# Patient Record
Sex: Male | Born: 1970 | Race: White | Hispanic: No | Marital: Married | Smoking: Never smoker
Health system: Southern US, Community
[De-identification: ages and names within clinical notes are randomized; demographics above are authoritative.]

## PROBLEM LIST (undated history)

## (undated) DIAGNOSIS — B029 Zoster without complications: Secondary | ICD-10-CM

## (undated) DIAGNOSIS — M1990 Unspecified osteoarthritis, unspecified site: Secondary | ICD-10-CM

## (undated) DIAGNOSIS — T7840XA Allergy, unspecified, initial encounter: Secondary | ICD-10-CM

## (undated) DIAGNOSIS — F329 Major depressive disorder, single episode, unspecified: Secondary | ICD-10-CM

## (undated) DIAGNOSIS — Z87442 Personal history of urinary calculi: Secondary | ICD-10-CM

## (undated) HISTORY — DX: Zoster without complications: B02.9

## (undated) HISTORY — DX: Allergy, unspecified, initial encounter: T78.40XA

## (undated) HISTORY — DX: Major depressive disorder, single episode, unspecified: F32.9

---

## 2005-07-13 ENCOUNTER — Ambulatory Visit: Payer: Self-pay | Admitting: Internal Medicine

## 2006-07-29 ENCOUNTER — Ambulatory Visit: Payer: Self-pay | Admitting: Internal Medicine

## 2006-12-03 ENCOUNTER — Ambulatory Visit: Payer: Self-pay | Admitting: Internal Medicine

## 2006-12-05 ENCOUNTER — Ambulatory Visit: Payer: Self-pay | Admitting: Internal Medicine

## 2007-05-13 ENCOUNTER — Ambulatory Visit: Payer: Self-pay | Admitting: Internal Medicine

## 2007-05-13 LAB — CONVERTED CEMR LAB: TSH: 0.91 microintl units/mL (ref 0.35–5.50)

## 2007-05-14 LAB — CONVERTED CEMR LAB

## 2007-05-20 ENCOUNTER — Telehealth (INDEPENDENT_AMBULATORY_CARE_PROVIDER_SITE_OTHER): Payer: Self-pay | Admitting: *Deleted

## 2007-06-13 ENCOUNTER — Telehealth: Payer: Self-pay | Admitting: Internal Medicine

## 2007-07-07 ENCOUNTER — Ambulatory Visit: Payer: Self-pay | Admitting: Internal Medicine

## 2007-12-16 ENCOUNTER — Ambulatory Visit: Payer: Self-pay | Admitting: Internal Medicine

## 2008-01-05 ENCOUNTER — Ambulatory Visit: Payer: Self-pay | Admitting: Internal Medicine

## 2008-06-15 ENCOUNTER — Ambulatory Visit: Payer: Self-pay | Admitting: Internal Medicine

## 2008-06-28 ENCOUNTER — Ambulatory Visit: Payer: Self-pay | Admitting: Internal Medicine

## 2008-06-30 ENCOUNTER — Ambulatory Visit: Payer: Self-pay | Admitting: Internal Medicine

## 2008-07-02 ENCOUNTER — Telehealth: Payer: Self-pay | Admitting: Internal Medicine

## 2008-07-02 ENCOUNTER — Ambulatory Visit: Payer: Self-pay | Admitting: Internal Medicine

## 2008-07-03 DIAGNOSIS — F329 Major depressive disorder, single episode, unspecified: Secondary | ICD-10-CM

## 2008-07-03 DIAGNOSIS — F3289 Other specified depressive episodes: Secondary | ICD-10-CM

## 2008-07-03 HISTORY — DX: Other specified depressive episodes: F32.89

## 2008-07-03 HISTORY — DX: Major depressive disorder, single episode, unspecified: F32.9

## 2008-12-09 ENCOUNTER — Telehealth: Payer: Self-pay | Admitting: Internal Medicine

## 2009-08-11 ENCOUNTER — Telehealth: Payer: Self-pay | Admitting: Internal Medicine

## 2009-08-15 ENCOUNTER — Ambulatory Visit: Payer: Self-pay | Admitting: Internal Medicine

## 2009-08-15 DIAGNOSIS — J309 Allergic rhinitis, unspecified: Secondary | ICD-10-CM | POA: Insufficient documentation

## 2010-01-03 ENCOUNTER — Encounter: Payer: Self-pay | Admitting: Internal Medicine

## 2010-04-26 ENCOUNTER — Encounter: Payer: Self-pay | Admitting: Internal Medicine

## 2010-05-17 ENCOUNTER — Encounter: Payer: Self-pay | Admitting: Internal Medicine

## 2010-06-08 ENCOUNTER — Ambulatory Visit: Payer: Self-pay | Admitting: Internal Medicine

## 2010-06-08 DIAGNOSIS — B029 Zoster without complications: Secondary | ICD-10-CM

## 2010-06-08 HISTORY — DX: Zoster without complications: B02.9

## 2010-09-24 HISTORY — PX: HIP SURGERY: SHX245

## 2010-10-03 ENCOUNTER — Ambulatory Visit
Admission: RE | Admit: 2010-10-03 | Discharge: 2010-10-03 | Payer: Self-pay | Source: Home / Self Care | Attending: Family Medicine | Admitting: Family Medicine

## 2010-10-24 NOTE — Consult Note (Signed)
Summary: [facility]  [facility]   Imported By: Maryln Gottron 01/11/2010 11:08:40  _____________________________________________________________________  External Attachment:    Type:   Image     Comment:   External Document

## 2010-10-24 NOTE — Consult Note (Signed)
Summary:  [facility]  [facility]   Imported By: Maryln Gottron 05/10/2010 13:19:26  _____________________________________________________________________  External Attachment:    Type:   Image     Comment:   External Document

## 2010-10-24 NOTE — Assessment & Plan Note (Signed)
Summary: ?shingles/njr   Vital Signs:  Costa profile:   41 year old male Weight:      206 pounds Temp:     98.8 degrees F oral BP sitting:   120 / 70  (right arm) Cuff size:   regular  Vitals Entered By: Duard Brady LPN (June 08, 2010 1:04 PM) CC: ?shingles to trunk - itching and pain Is Costa Diabetic? No   CC:  ?shingles to trunk - itching and pain.  History of Present Illness: Jonathan Costa, who presents with a 4-day history of a painful dermatitis involving his right anterolateral chest wall area.  He did notice some mild discomfort that preceded the rash.  Allergies: 1)  ! * Pink Dye of Benadryl  Past History:  Past Medical History: Allergic rhinitis Depression shingles September 2011  Review of Systems       The Costa complains of suspicious skin lesions.    Physical Exam  General:  Well-developed,well-nourished,in no acute distress; alert,appropriate and cooperative throughout examination Skin:  erythematous herpetic rash involving his right anterolateral chest wall   Impression & Recommendations:  Problem # 1:  SHINGLES (ICD-053.9)  Complete Medication List: 1)  Aleve 220 Mg Tabs (Naproxen sodium) .... As needed 2)  Allegra-d 12 Hour 60-120 Mg Tb12 (Fexofenadine-pseudoephedrine) .... Once daily 3)  Celexa 20 Mg Tabs (Citalopram hydrobromide) .... 1/2 once daily 4)  Multivitamins Tabs (Multiple vitamin) .... Once daily 5)  Fluticasone Propionate 50 Mcg/act Susp (Fluticasone propionate) .... 2 sprays each nostril once daily 6)  Valacyclovir Hcl 1 Gm Tabs (Valacyclovir hcl) .... One tablet 3  times daily 7)  Prednisone (pak) 5 Mg Tabs (Prednisone) .... As directed 8)  Hydrocodone-acetaminophen 5-500 Mg Tabs (Hydrocodone-acetaminophen) .... One or two every 6 hours as needed for pain  Costa Instructions: 1)  Please schedule a follow-up appointment as needed. Prescriptions: HYDROCODONE-ACETAMINOPHEN 5-500 MG TABS  (HYDROCODONE-ACETAMINOPHEN) one or two every 6 hours as needed for pain  #50 x 0   Entered and Authorized by:   Gordy Savers  MD   Signed by:   Gordy Savers  MD on 06/08/2010   Method used:   Print then Give to Costa   RxID:   [id number] PREDNISONE (PAK) 5 MG TABS (PREDNISONE) as directed  #5 mg 6 day x 0   Entered and Authorized by:   Gordy Savers  MD   Signed by:   Gordy Savers  MD on 06/08/2010   Method used:   Print then Give to Costa   RxID:   [id number] VALACYCLOVIR HCL 1 GM TABS (VALACYCLOVIR HCL) one tablet 3  times daily  #21 x 0   Entered and Authorized by:   Gordy Savers  MD   Signed by:   Gordy Savers  MD on 06/08/2010   Method used:   Print then Give to Costa   RxID:   [id number]

## 2010-10-24 NOTE — Consult Note (Signed)
Summary: [facility]  [facility]   Imported By: Sherian Rein 06/02/2010 13:21:54  _____________________________________________________________________  External Attachment:    Type:   Image     Comment:   External Document

## 2010-10-26 NOTE — Assessment & Plan Note (Signed)
Summary: ?sinus inf or bronchitus/cjr   Vital Signs:  Patient profile:   40 year old male Temp:     97.9 degrees F oral BP sitting:   120 / 78  (left arm) Cuff size:   regular  Vitals Entered By: Sid Falcon LPN (October 03, 2010 9:34 AM)  History of Present Illness: "Cold" since Christmas.  Started as head cold.  Still has some sinus congestion. Allegra D and Flonase not helping.  Maliase.  No fever. Cough productive for over 2 weeks.  Headaches and some L max pain.  Pt has perennial allergies.  meds above not  working.    Allergies: 1)  ! 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  Otoscopic examination reveals clear canals, tympanic membranes are intact bilaterally without bulging, retraction, inflammation or discharge. Hearing is grossly normal bilaterally. Nose:  erythema and swelling nasal mucosa. Mouth:  Oral mucosa and oropharynx without lesions or exudates.  Teeth in good repair. Neck:  No deformities, masses, or tenderness noted. Lungs:  Normal respiratory effort, chest expands symmetrically. Lungs are clear to auscultation, no crackles or wheezes. Heart:  Normal rate and regular rhythm. S1 and S2 normal without gallop, murmur, click, rub or other extra sounds.   Impression & Recommendations:  Problem # 1:  SINUSITIS, ACUTE  (ICD-461.9)  His updated medication list for this problem includes:    Allegra-d 12 Hour 60-120 Mg Tb12 (Fexofenadine-pseudoephedrine) ..... Once daily    Fluticasone Propionate 50 Mcg/act Susp (Fluticasone propionate) .Marland Kitchen... 2 sprays each nostril once daily    Amoxicillin-pot Clavulanate 875-125 Mg Tabs (Amoxicillin-pot clavulanate) ..... One by mouth two times a day for 10 days    Astelin 137 Mcg/spray Soln (Azelastine hcl) .Marland Kitchen... 1-2 sprays per nostril two times a day  Complete Medication List: 1)  Aleve 220 Mg Tabs (Naproxen sodium) .... As needed 2)  Allegra-d 12 Hour 60-120 Mg Tb12 (Fexofenadine-pseudoephedrine) .... Once daily 3)  Celexa 20 Mg Tabs (Citalopram hydrobromide) .... 1/2 once daily 4)  Multivitamins Tabs (Multiple vitamin) .... Once daily 5)  Fluticasone Propionate 50 Mcg/act Susp (Fluticasone propionate) .... 2 sprays each nostril once daily 6)  Valacyclovir Hcl 1 Gm Tabs (Valacyclovir hcl) .... One tablet 3  times daily 7)  Prednisone (pak) 5 Mg Tabs (Prednisone) .... As directed 8)  Hydrocodone-acetaminophen 5-500 Mg Tabs (Hydrocodone-acetaminophen) .... One or two every 6 hours as needed for pain 9)  Amoxicillin-pot Clavulanate 875-125 Mg Tabs (Amoxicillin-pot clavulanate) .... One by mouth two times a day for 10 days 10)  Astelin 137 Mcg/spray Soln (Azelastine hcl) .Marland Kitchen.. 1-2 sprays per nostril two times a day  Patient Instructions: 1)  Acute sinusitis symptoms for less than 10 days are not helped by antibiotics. Use warm moist compresses, and over the counter decongestants( only as directed). Call if no improvement in 5-7 days, sooner if increasing pain, fever, or new symptoms.  Prescriptions: ASTELIN 137 MCG/SPRAY SOLN (AZELASTINE HCL) 1-2 sprays per nostril two times a  day  #1 x 11   Entered and Authorized by:   Evelena Peat MD   Signed by:   Evelena Peat MD on 10/03/2010   Method used:   Electronically to        [facility] [address] [phone]* (retail)        [address]       [place], [place]  [address]       Ph: [phone] or [phone]       Fax: [phone]   RxID:   [id number] AMOXICILLIN-POT CLAVULANATE 875-125 MG TABS (AMOXICILLIN-POT CLAVULANATE) one by mouth two times a day for 10 days  #20 x 0   Entered and Authorized by:   Evelena Peat MD   Signed by:   Evelena Peat MD on 10/03/2010   Method used:   [facility]ically to        [facility] [address] [phone]* (retail)       [address]       [place], [place]  [address]       Ph: [phone] or [phone]       Fax: [phone]   RxID:   [id number]    Orders Added: 1)  Est. Patient Level III [76160]

## 2010-11-18 ENCOUNTER — Encounter (HOSPITAL_COMMUNITY): Payer: Self-pay | Admitting: Radiology

## 2010-11-18 ENCOUNTER — Emergency Department (HOSPITAL_COMMUNITY): Payer: PRIVATE HEALTH INSURANCE

## 2010-11-18 ENCOUNTER — Inpatient Hospital Stay (HOSPITAL_COMMUNITY)
Admission: EM | Admit: 2010-11-18 | Discharge: 2010-11-24 | DRG: 498 | Disposition: A | Discharge status: Home / Self Care | Payer: PRIVATE HEALTH INSURANCE | Attending: General Surgery | Admitting: General Surgery

## 2010-11-18 DIAGNOSIS — R51 Headache: Secondary | ICD-10-CM | POA: Diagnosis not present

## 2010-11-18 DIAGNOSIS — S7000XA Contusion of unspecified hip, initial encounter: Secondary | ICD-10-CM | POA: Diagnosis present

## 2010-11-18 DIAGNOSIS — S0120XA Unspecified open wound of nose, initial encounter: Secondary | ICD-10-CM | POA: Diagnosis present

## 2010-11-18 DIAGNOSIS — D62 Acute posthemorrhagic anemia: Secondary | ICD-10-CM | POA: Diagnosis not present

## 2010-11-18 DIAGNOSIS — M25519 Pain in unspecified shoulder: Secondary | ICD-10-CM | POA: Diagnosis present

## 2010-11-18 DIAGNOSIS — S73016A Posterior dislocation of unspecified hip, initial encounter: Secondary | ICD-10-CM | POA: Diagnosis present

## 2010-11-18 DIAGNOSIS — W208XXA Other cause of strike by thrown, projected or falling object, initial encounter: Secondary | ICD-10-CM | POA: Diagnosis present

## 2010-11-18 DIAGNOSIS — S32409A Unspecified fracture of unspecified acetabulum, initial encounter for closed fracture: Principal | ICD-10-CM | POA: Diagnosis present

## 2010-11-18 DIAGNOSIS — B029 Zoster without complications: Secondary | ICD-10-CM | POA: Diagnosis not present

## 2010-11-18 LAB — COMPREHENSIVE METABOLIC PANEL
Alkaline Phosphatase: 63 U/L (ref 39–117)
BUN: 15 mg/dL (ref 6–23)
Chloride: 104 mEq/L (ref 96–112)
Glucose, Bld: 168 mg/dL — ABNORMAL HIGH (ref 70–99)
Potassium: 3.6 mEq/L (ref 3.5–5.1)
Total Bilirubin: 0.5 mg/dL (ref 0.3–1.2)
Total Protein: 6.5 g/dL (ref 6.0–8.3)

## 2010-11-18 LAB — CBC
HCT: 42.3 % (ref 39.0–52.0)
MCH: 31.1 pg (ref 26.0–34.0)
MCV: 90.8 fL (ref 78.0–100.0)
RDW: 12.5 % (ref 11.5–15.5)
WBC: 12.5 10*3/uL — ABNORMAL HIGH (ref 4.0–10.5)

## 2010-11-18 LAB — POCT I-STAT, CHEM 8
BUN: 18 mg/dL (ref 6–23)
Calcium, Ion: 1.05 mmol/L — ABNORMAL LOW (ref 1.12–1.32)
Chloride: 106 mEq/L (ref 96–112)
Creatinine, Ser: 1.1 mg/dL (ref 0.4–1.5)

## 2010-11-18 LAB — LACTIC ACID, PLASMA
Lactic Acid, Venous: 3.3 mmol/L — ABNORMAL HIGH (ref 0.5–2.2)
Lactic Acid, Venous: 3.8 mmol/L — ABNORMAL HIGH (ref 0.5–2.2)

## 2010-11-18 MED ORDER — IOHEXOL 300 MG/ML  SOLN
100.0000 mL | Freq: Once | INTRAMUSCULAR | Status: AC | PRN
  Administered 2010-11-18: 100 mL via INTRAVENOUS

## 2010-11-19 ENCOUNTER — Inpatient Hospital Stay (HOSPITAL_COMMUNITY): Payer: PRIVATE HEALTH INSURANCE

## 2010-11-19 LAB — CBC
HCT: 38.8 % — ABNORMAL LOW (ref 39.0–52.0)
MCHC: 33 g/dL (ref 30.0–36.0)
MCV: 91.1 fL (ref 78.0–100.0)
RDW: 12.7 % (ref 11.5–15.5)

## 2010-11-19 LAB — BASIC METABOLIC PANEL
BUN: 14 mg/dL (ref 6–23)
Calcium: 8.5 mg/dL (ref 8.4–10.5)
GFR calc non Af Amer: >60 mL/min (ref >60)
Glucose, Bld: 162 mg/dL — ABNORMAL HIGH (ref 70–99)

## 2010-11-20 ENCOUNTER — Inpatient Hospital Stay (HOSPITAL_COMMUNITY): Payer: PRIVATE HEALTH INSURANCE

## 2010-11-20 LAB — POCT I-STAT 4, (NA,K, GLUC, HGB,HCT)
Glucose, Bld: 92 mg/dL (ref 70–99)
Glucose, Bld: 99 mg/dL (ref 70–99)
HCT: 33 % — ABNORMAL LOW (ref 39.0–52.0)
Hemoglobin: 11.2 g/dL — ABNORMAL LOW (ref 13.0–17.0)
Potassium: 4 meq/L (ref 3.5–5.1)
Sodium: 140 meq/L (ref 135–145)

## 2010-11-20 LAB — CBC
HCT: 35 % — ABNORMAL LOW (ref 39.0–52.0)
Platelets: 274 10*3/uL (ref 150–400)
RDW: 13 % (ref 11.5–15.5)
WBC: 12.2 10*3/uL — ABNORMAL HIGH (ref 4.0–10.5)

## 2010-11-20 LAB — URINALYSIS, ROUTINE W REFLEX MICROSCOPIC
Nitrite: NEGATIVE
Protein, ur: NEGATIVE mg/dL
Urine Glucose, Fasting: NEGATIVE mg/dL

## 2010-11-21 LAB — CBC
HCT: 30.7 % — ABNORMAL LOW (ref 39.0–52.0)
Hemoglobin: 10 g/dL — ABNORMAL LOW (ref 13.0–17.0)
MCH: 30.1 pg (ref 26.0–34.0)
MCV: 92.5 fL (ref 78.0–100.0)
RBC: 3.32 MIL/uL — ABNORMAL LOW (ref 4.22–5.81)
WBC: 11.8 10*3/uL — ABNORMAL HIGH (ref 4.0–10.5)

## 2010-11-21 LAB — BASIC METABOLIC PANEL
BUN: 6 mg/dL (ref 6–23)
CO2: 32 mEq/L (ref 19–32)
Chloride: 102 mEq/L (ref 96–112)
Creatinine, Ser: 0.84 mg/dL (ref 0.4–1.5)
Glucose, Bld: 100 mg/dL — ABNORMAL HIGH (ref 70–99)
Potassium: 4.2 mEq/L (ref 3.5–5.1)
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
## 2010-11-22 LAB — CBC
HCT: 29.7 % — ABNORMAL LOW (ref 39.0–52.0)
MCH: 30.2 pg (ref 26.0–34.0)
MCV: 91.7 fL (ref 78.0–100.0)
Platelets: 262 10*3/uL (ref 150–400)
RDW: 12.4 % (ref 11.5–15.5)
WBC: 9.4 10*3/uL (ref 4.0–10.5)

## 2010-11-23 ENCOUNTER — Inpatient Hospital Stay (HOSPITAL_COMMUNITY): Payer: PRIVATE HEALTH INSURANCE

## 2010-11-23 LAB — TYPE AND SCREEN
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
## 2010-11-23 NOTE — Consult Note (Signed)
NAMEORBY, TANGEN               ACCOUNT NO.:  [id number]  MEDICAL RECORD NO.:  [id number]           PATIENT TYPE:  I  LOCATION:  5004                         FACILITY:  [facility]  PHYSICIAN:  Doralee Albino. Carola Frost, M.D. DATE OF BIRTH:  January 14, 1971  DATE OF CONSULTATION:  11/18/2010 DATE OF DISCHARGE:                                CONSULTATION   REQUESTING PHYSICIAN:  Trauma Service, Dr. Magnus Ivan.  REASON FOR CONSULTATION REQUEST:  Right acetabular fracture dislocation.  BRIEF HISTORY OF PRESENTATION:  Jonathan Costa is a 40 year old male otherwise healthy individual who was cutting ivy in his yard when a tree totally unrelated to his yard work spontaneously cracked and fell on him.  He was able to avoid enough direct impact that he survived, but was crushed beneath the tree with a fracture-dislocation of his right hip.  He was unable to move and sustained trauma to the left shoulder and nose.  He was transported by EMS to [facility] where further trauma evaluation was performed.  I was contacted for definitive management of this fracture while the patient was in the [place].  On evaluation, he is alert and oriented, and he accompanied by his father and wife who was able to answer all questions appropriately.  He denied head injury.  Denied numbness, tingling, and right knee pain.  PAST MEDICAL HISTORY:  History of small rotator cuff tear, left shoulder; history of vertigo and shingles over the past year.  No other medical problems.  PAST SURGICAL HISTORY:  Wisdom teeth extraction, otherwise none.  SOCIAL HISTORY:  The patient does not smoke, use drugs, or drink alcohol significantly.  He is allergic BENADRYL.  He does not take any medications regularly.  REVIEW OF SYSTEMS:  Negative for chest pain, shortness of breath, recent infection, bleeding disorder, or neurologic condition.  PHYSICAL EXAMINATION:  The bridge of the nose has a significant laceration, superior portion  of which appears to have been Dermabonded, and inferior portion, which is continuing to bleed appears to be deep and is about 15 mm in length. The left shoulder is tender along the scapula posteriorly, but does not have any focal tenderness of the glenohumeral joint.  No instability. AC joints are nontender bilaterally.  No ecchymosis, crepitus, or decreased motion about the shoulders, elbows, wrists, and hands.  His pelvis is stable, nontender.  His right hip is flexed to 70 degrees, and his knee bent to 125.  He has no knee effusion.  There is an abrasion over the right hip and associated hematoma.  He does not have any joint line discomfort of the knee.  The ankle and tibia is nontender, no ecchymosis, no crepitus.  Foot nontender, no crepitus.  Strength examination obviously could not be performed.  Intact superficial peroneal and tibial nerve sensation, able to extend the great and lesser toes.  I was unable to assess eversion.  DP pulses are 2+ as was posterior tib.  Left lower extremity free of any focal ecchymosis, tenderness, crepitus, cantilever, or instability.  Intact deep peroneal, superficial peroneal, and tibial nerve sensory, and motor function.  I was unable to roll  him and palpate his back.  However, this had been performed by the Trauma Service, and they did not distinguish any focal evidence of tenderness, step-offs, or other sign indicative of spinal injury.  X-RAYS:  Plain film demonstrates a fracture-dislocation of the right acetabulum with a transverse posterior wall pattern and significant comminution along the margin of the posterior wall fracture.  ASSESSMENT: 1. Right hip fracture dislocation involving the acetabulum. 2. Nasal fracture.  PLAN:  I have discussed the risks and benefits of closed reduction and the risks and benefits of placement of a traction pin through the tibia. These include nerve injury, vessel injury, incarceration of fragments within  the joint, failure to prevent AVN or arthritis, the need for subsequent surgery including surgical reconstruction of the fracture and multiple others.  Furthermore, we discussed the nose fracture, the possibility of the need for consultation with facial surgeon including ENT or plastics.  We will clean out the wound and possibly proceed with repair depending upon the intraoperative findings.  After full discussion, both the patient and his wife strongly wished to proceed. Informed consent was obtained.     Doralee Albino. Carola Frost, M.D.     MHH/MEDQ  D:  11/18/2010  T:  11/19/2010  Job:  [id number]  Electronically Signed by Myrene Galas M.D. on 11/23/2010 12:02:17 PM

## 2010-11-23 NOTE — Op Note (Signed)
Jonathan Costa, Jonathan Costa               ACCOUNT NO.:  [id number]  MEDICAL RECORD NO.:  [id number]           PATIENT TYPE:  I  LOCATION:  5004                         FACILITY:  [facility]  PHYSICIAN:  Doralee Albino. Carola Frost, M.D. DATE OF BIRTH:  1971-04-28  DATE OF PROCEDURE:  11/20/2010 DATE OF DISCHARGE:                              OPERATIVE REPORT   PREOPERATIVE DIAGNOSES: 1. Right transverse posterior wall acetabular fracture, status post     relocation. 2. Retained traction pin.  POSTOPERATIVE DIAGNOSES: 1. Right transverse posterior wall acetabular fracture, status post     relocation. 2. Retained traction pin. 3. Morel-Lavallee lesion.  PROCEDURES: 1. ORIF of right transverse posterior wall acetabular fracture. 2. Incision and drainage of right Morel-Lavallee degloving lesion. 3. Removal of traction pin.  SURGEON:  Doralee Albino. Carola Frost, MD.  ASSISTANT:  Mearl Latin, PA-C.  ANESTHESIA:  General.  COMPLICATIONS:  None.  DRAINS:  One into the [facility] lesion.  FINDINGS:  Brisk bleeding with placement of a drill just off the articular margin of the femoral head.  Additional findings, extensive comminution of the articular surface with marginal impaction and intra- articular fragments.  BLOOD LOSS:  Approximately 500 mL, 250 of which was from the [facility] lesion.  DISPOSITION:  To PACU.  CONDITION:  Stable.  BRIEF SUMMARY AND INDICATIONS FOR PROCEDURE:  Jonathan Costa is a 40- year-old male who was struck by a 50 feet tree, resulting a fracture/dislocation of the right hip.  He underwent closed reduction with placement of skeletal traction for suspected intra-articular fragments approximately 48 hours before.  I discussed with him and his wife the risks and benefits of surgery including the possibility of infection, nerve injury, vessel injury, instability, arthritis, DVT, PE, avascular necrosis, need for further surgery including total hip arthroplasty, and multiple  others.  After full discussion, he and his wife wished to proceed.  BRIEF DESCRIPTION OF PROCEDURE:  Jonathan Costa was taken to the operating room where general anesthesia was induced.  Foley catheter was placed.  The traction pin was clipped near the skin and withdrawn without complication.  The right lower extremity was then prepped and draped in usual sterile fashion after positioning him right side up with all prominences padded appropriately.  Standard Kocher-Langenbeck incision and approach was used splitting the tensor in line with the skin incision.  The maximus split exceptionally well along muscular plane with no disruption of the muscular fibers.  He had considerable swelling.  I encountered fat and serous material at the apex of the incision where Morel-Lavallee lesion was identified.  This extended into a large pocket completely over the posterior iliac crest and was large enough in which to place a hand.  However, we kept the neck closed and evacuated the hematoma.  The medius muscle was retracted anteriorly exposing the minimus, which had considerable contusion and the most posterior or lateral aspect of the minimus was noncontractile necrotic and consequently debrided.  We immediately encountered multiple fragments of articular surface and these were saved and placed in saline.  Marginal impaction could be visualized along the femoral head. There was a rim fracture.  A large inferior-posterior wall fragment and then additional rim fragment.  A Schanz pin was placed into the proximal femur, and after thoroughly irrigating with a pulsatile lavage, the hip joint was distracted.  Numerous free fragments were removed from the joint and the ligamentum was debrided.  This greatly facilitated removal.  I did not identify any large scale scuffs on the femoral head or full-thickness cartilage loss.  I did drill the femoral head just off the edge of the articular surface and encountered  immediate brisk bleeding consistent with intact vascularity.  The transverse component of the fracture was dealt with first by using a Synthes four-hole plate slightly over-contoured just at the fracture edge to allow for compression on the far side or anterior aspect of the fracture. Initially, the 2 extreme holes were placed in compression mode to further squeeze this transverse fracture together posteriorly with visualization of compression at the fracture site and an anatomic reduction.  The 2 mean screws were also bicortical and as tightened produced additional compression on the anterior column aspect of the transverse fracture. Once this was complete, the pieces of osteochondral surface along the posterior wall were replaced first addressing the marginal impaction and then placing a large wall piece.  I then reconstructed the rim and placed 2 lag screws, 1 posterior-inferior and 1 posterior-superior.  I was unable to place the more inferior lag screw in a way that could be clearly discern is being outside the joint and consequently left this screw which appeared to result in an anatomic reduction while fashioning a spring plate to hook onto the rim.  Two screws were placed in this, achieving first contact and maintenance of reduction and then compression of the rim fragment against the rest of the wall.  Multiple C-arm images were used to confirm completely extra-articular placement. The buttress plate was then secured in the ischium and wrapped around the posterior wall and secured with 2 screws proximally and then placing an additional screw along the ischium to further tighten down the plate against the posterior wall.  This resulted in what appeared to be an anatomic reduction and excellent buttress of the transverse and posterior wall fractures.  There was a triangle of bone loss that was filled with Plexur M allograft allowing that harden and cure into place prior to  placement of the buttress plate over top.  Final images showed again appropriate and concentric reduction, hardware placement, length and trajectory.  The short rotators and piriformis which had been divided and carefully retracted to allow for subperiosteal dissection down the ischium and on the retroacetabular surface. We then repaired back through bone tunnels securing first the obturator internus or short rotator tendon which was quite robust as well as the piriformis tendon. Prior to closure, extensive lavage was once more performed with the Pulsavac and the Pulsavac was used again on the subcutaneous tissues prior to their closure.  Tensor was reapproximated with figure-of-eight interrupted #1 Vicryl, 0 for the deep subcu, 2-0 and staples for the shallow subcu and skin respectively.  Sterile gently compressive dressing was applied and an abduction pillow.  Prior to closure of the main wound, we did once more evacuate the [facility] lesion and placed a large Hemovac within this cavity, bring it out through a separate incision to avoid contamination with wound, which was closed securely along its apex to prevent any or to minimize the chance of cross contamination.  Montez Morita, PA-C assisted me throughout the procedure, was absolutely necessary for  the safe and effective completion of the case as he distracted the hip joint, and retracted the abductors and sciatic nerve to keep free from injury at all times.  The patient's hip was placed in excessive abduction of at least 40 degrees throughout the case with full hip extension and full knee flexion to further relax the sciatic nerve.  PROGNOSIS:  Postoperative examination in the PACU demonstrated intact sciatic nerve function grossly.  Jonathan Costa will maintain posterior hip precautions and touchdown weightbearing for the next 8 weeks with graduated weightbearing thereafter.  He is at significantly increased risk for early onset arthritis  given his articular instruction.  At this point, it appears he has maintained vascularity despite the dislocation. He will be on DVT prophylaxis with Lovenox and discharged with a 20-day course.  We anticipate radiation treatment for HO prophylaxis in the next 24 to 48 hours.     Doralee Albino. Carola Frost, M.D.     MHH/MEDQ  D:  11/20/2010  T:  11/21/2010  Job:  [id number]  Electronically Signed by Myrene Galas M.D. on 11/23/2010 12:09:05 PM

## 2010-12-13 NOTE — Discharge Summary (Signed)
NAMEGAEL, Jonathan Costa               ACCOUNT NO.:  [id number]  MEDICAL RECORD NO.:  [id number]           PATIENT TYPE:  I  LOCATION:  5004                         FACILITY:  [facility]  PHYSICIAN:  Gabrielle Dare. [name], M.D.DATE OF BIRTH:  03/27/1971  DATE OF ADMISSION:  11/18/2010 DATE OF DISCHARGE:  11/24/2010                              DISCHARGE SUMMARY   DISCHARGE DIAGNOSES: 1. Blunt trauma. 2. Right acetabular fracture/dislocation. 3. Nasal laceration. 4. Alcohol use. 5. Acute blood loss anemia. 6. Shingles.  CONSULTANTS:  Dr. Pollyann Kennedy for facial surgery and Dr. Carola Frost for orthopedic surgery.  PROCEDURES: 1. Closed reduction, right hip dislocation with placement of skeletal     traction pin by Dr. Carola Frost. 2. I and D of nasal laceration by Dr. Carola Frost. 3. Complex closure nasal laceration by Dr. Pollyann Kennedy. 4. ORIF of acetabular fracture by Dr. Carola Frost.  HISTORY OF PRESENT ILLNESS:  This is a 40 year old white male who was working in his yard when  a large tree fell down striking him and pinning him under it.  He came to the hospital as a level II trauma complaining of left shoulder and right hip pain.  Workup showed acetabular fracture with a hip dislocation.  He was admitted and orthopedic surgery was consulted.  His nasal laceration reportedly was closed with some Dermabonds.  He was then taken to the operating room for emergency surgery.  HOSPITAL COURSE:  During surgery, it was felt that the nasal wound had foreign debridement.  It was reopened and washed out.  Facial surgery was consulted and performed a complex closure of it.  He had his hip relocated and a skeletal traction pin put in.  He was then transferred for further care to the floor.  After several days the patient was able to be taken back to the operating room for definitive fixation of his hip.  He had significant problems with pain.  He was started on one regimen which caused bad headache.  It was thought to be the  Ultram and so that was removed. However, the headaches persisted and so it was thought to be the oxycodone.  That was switched to a mixture of MS Contin and hydrocodone which seemed to fix the headache problem.  He had his pain adequately controlled on this.  He had done well with physical and occupational therapy following a radiation treatment to prevent heterotopic ossification.  He was able to be transferred home in good condition care of his wife.  DISCHARGE MEDICATIONS: 1. Lovenox 40 mg subcutaneously daily for 3 weeks #21 syringes with no     refill. 2. Senokot to take 1 tablet twice daily before meals, #60 with no     refill. 3. Robaxin 500 mg take every 8 hours as needed #90 with no refill. 4. Valacyclovir 1000 mg by mouth every 8 hours for a total treatment     of 1 week. 5. Dulcolax 10 mg by mouth twice daily #35 mg tablets with no refill. 6. Sustained-release morphine 50 mg tablets, take 3 p.o. q.8 h. #60     with no refill. 7. Norco 10/325 take 1-2  every 4 hours as needed #60 with no refill.  FOLLOWUP:  The patient will need to follow up with Dr. Carola Frost, Dr. Pollyann Kennedy and will call for appointments.  Follow up with Trauma Service will be on an as-needed basis.     Jonathan Costa, P.A.   ______________________________ Gabrielle Dare. [name], M.D.    MJ/MEDQ  D:  11/24/2010  T:  11/25/2010  Job:  [id number]  cc:   Doralee Albino. Carola Frost, M.D. Dr. Pollyann Kennedy  Electronically Signed by Charma Igo P.A. on 12/05/2010 01:31:48 PM Electronically Signed by Violeta Gelinas M.D. on 12/13/2010 04:38:30 PM

## 2011-01-19 ENCOUNTER — Other Ambulatory Visit: Payer: Self-pay | Admitting: *Deleted

## 2011-01-19 MED ORDER — CITALOPRAM HYDROBROMIDE 20 MG PO TABS: 20.0000 mg | ORAL_TABLET | Freq: Every day | ORAL | Status: DC

## 2011-03-29 ENCOUNTER — Other Ambulatory Visit: Payer: Self-pay | Admitting: Internal Medicine

## 2011-04-24 ENCOUNTER — Ambulatory Visit (INDEPENDENT_AMBULATORY_CARE_PROVIDER_SITE_OTHER): Payer: PRIVATE HEALTH INSURANCE | Admitting: Internal Medicine

## 2011-04-24 ENCOUNTER — Encounter: Payer: Self-pay | Admitting: Internal Medicine

## 2011-04-24 VITALS — BP 112/80 | HR 80 | Temp 98.4°F | Resp 20 | Ht 73.5 in | Wt 202.0 lb

## 2011-04-24 DIAGNOSIS — J069 Acute upper respiratory infection, unspecified: Secondary | ICD-10-CM

## 2011-04-24 MED ORDER — HYDROCODONE-ACETAMINOPHEN 5-500 MG PO TABS: 1.0000 | ORAL_TABLET | Freq: Four times a day (QID) | ORAL | Status: DC | PRN

## 2011-04-24 NOTE — Patient Instructions (Signed)
Get plenty of rest, Drink lots of  clear liquids, and use Tylenol or ibuprofen for fever and discomfort.    Call or return to clinic prn if these symptoms worsen or fail to improve as anticipated.  

## 2011-04-24 NOTE — Progress Notes (Signed)
  Subjective:    Patient ID: Jonathan Costa, male    DOB: 1971-05-29, 40 y.o.   MRN: [id number]  HPI  40 year old patient who awoke with fever last night. He has had some nonproductive cough chest congestion and mild sore throat. His wife has a similar illness that has resolved. He does have young children at home. Presently no fever or chills. Does have a history of allergic rhinitis but has not taken medications chronically at this time.    Review of Systems  Constitutional: Positive for fever and fatigue. Negative for chills and appetite change.  HENT: Positive for congestion. Negative for hearing loss, ear pain, sore throat, trouble swallowing, neck stiffness, dental problem, voice change and tinnitus.   Eyes: Negative for pain, discharge and visual disturbance.  Respiratory: Negative for cough, chest tightness, wheezing and stridor.   Cardiovascular: Negative for chest pain, palpitations and leg swelling.  Gastrointestinal: Negative for nausea, vomiting, abdominal pain, diarrhea, constipation, blood in stool and abdominal distention.  Genitourinary: Negative for urgency, hematuria, flank pain, discharge, difficulty urinating and genital sores.  Musculoskeletal: Negative for myalgias, back pain, joint swelling, arthralgias and gait problem.  Skin: Negative for rash.  Neurological: Negative for dizziness, syncope, speech difficulty, weakness, numbness and headaches.  Hematological: Negative for adenopathy. Does not bruise/bleed easily.  Psychiatric/Behavioral: Negative for behavioral problems and dysphoric mood. The patient is not nervous/anxious.        Objective:   Physical Exam  Constitutional: He is oriented to person, place, and time. He appears well-developed.  HENT:  Head: Normocephalic.  Right Ear: External ear normal.  Left Ear: External ear normal.  Eyes: Conjunctivae and EOM are normal.  Neck: Normal range of motion.  Cardiovascular: Normal rate and normal heart sounds.     Pulmonary/Chest: Effort normal and breath sounds normal. No respiratory distress. He has no wheezes. He has no rales. He exhibits no tenderness.  Abdominal: Bowel sounds are normal.  Musculoskeletal: Normal range of motion. He exhibits no edema and no tenderness.  Neurological: He is alert and oriented to person, place, and time.  Psychiatric: He has a normal mood and affect. His behavior is normal.          Assessment & Plan:   No problem-specific assessment & plan notes found for this encounter.   Viral URI. Treat symptomatically with antitussives Tylenol and naproxen

## 2011-04-26 ENCOUNTER — Telehealth: Payer: Self-pay | Admitting: *Deleted

## 2011-04-26 MED ORDER — DOXYCYCLINE HYCLATE 100 MG PO TABS: 100.0000 mg | ORAL_TABLET | Freq: Two times a day (BID) | ORAL | Status: DC

## 2011-04-26 NOTE — Telephone Encounter (Signed)
Pt is no better with sinus infection and would like an antibiotic. Still coughing really bad and congested.

## 2011-04-26 NOTE — Telephone Encounter (Signed)
Wife would like an antibiotic that is not sun sensitive as they are going to the beach this week.

## 2011-04-26 NOTE — Telephone Encounter (Signed)
Amoxicillin 500 mg #30 one 3 times daily with meals

## 2011-04-26 NOTE — Telephone Encounter (Signed)
Doxycycline 100  #20  BID

## 2011-04-27 MED ORDER — AMOXICILLIN 500 MG PO CAPS: 500.0000 mg | ORAL_CAPSULE | Freq: Three times a day (TID) | ORAL | Status: AC

## 2011-04-27 NOTE — Telephone Encounter (Signed)
Notified pt. 

## 2011-05-01 ENCOUNTER — Ambulatory Visit: Payer: PRIVATE HEALTH INSURANCE | Admitting: Internal Medicine

## 2012-04-18 ENCOUNTER — Encounter: Payer: Self-pay | Admitting: Internal Medicine

## 2012-04-18 ENCOUNTER — Ambulatory Visit (INDEPENDENT_AMBULATORY_CARE_PROVIDER_SITE_OTHER): Payer: BC Managed Care – PPO | Admitting: Internal Medicine

## 2012-04-18 VITALS — BP 128/82 | Temp 98.1°F | Wt 199.0 lb

## 2012-04-18 DIAGNOSIS — J329 Chronic sinusitis, unspecified: Secondary | ICD-10-CM

## 2012-04-18 MED ORDER — AMOXICILLIN 875 MG PO TABS: 875.0000 mg | ORAL_TABLET | Freq: Two times a day (BID) | ORAL | Status: AC

## 2012-04-18 NOTE — Patient Instructions (Addendum)
Use nasal saline as directed Please call our office if your symptoms do not improve or gets worse.  

## 2012-04-18 NOTE — Progress Notes (Signed)
  Subjective:    Patient ID: Jonathan Costa, male    DOB: [birth year], 41 y.o.   MRN: [id number]  URI  This is a new problem. The current episode started in the past 7 days. There has been no fever. Associated symptoms include congestion, coughing, ear pain and a sore throat. He has tried antihistamine for the symptoms. The treatment provided no relief.   Patient complains of sinus pain/congestion on left side. Sputum is discolored. (Yellow)      Review of Systems  HENT: Positive for ear pain, congestion and sore throat.   Respiratory: Positive for cough.    Past Medical History  Diagnosis Date  . DEPRESSION 07/03/2008  . SHINGLES 06/08/2010  . Allergy     History   Social History  . Marital Status: Married    Spouse Name: N/A    Number of Children: N/A  . Years of Education: N/A   Occupational History  . Not on file.   Social History Main Topics  . Smoking status: Never Smoker   . Smokeless tobacco: Never Used  . Alcohol Use: Yes  . Drug Use: No  . Sexually Active: Not on file   Other Topics Concern  . Not on file   Social History Narrative  . No narrative on file    No past surgical history on file.  No family history on file.  Allergies  Allergen Reactions  . Latex     Current Outpatient Prescriptions on File Prior to Visit  Medication Sig Dispense Refill  . fexofenadine-pseudoephedrine (ALLEGRA-D) 60-120 MG per tablet Take 1 tablet by mouth 2 (two) times daily.        . Multiple Vitamin (MULTIVITAMIN) tablet Take 1 tablet by mouth daily.        . naproxen sodium (ALEVE) 220 MG tablet Take 220 mg by mouth as needed.          BP 128/82  Temp 98.1 F (36.7 C) (Oral)  Wt 199 lb (90.266 kg)       Objective:   Physical Exam   Constitutional: Appears well-developed and well-nourished. No distress.  Head: Normocephalic and atraumatic.  Ear:  Right and left tympanic membranes appear retracted Mouth/Throat: Oropharyngeal erythema with signs of  postnasal drip  Eyes: Conjunctivae are normal. Pupils are equal, round, and reactive to light.  Neck: No neck tenderness or cervical adenopathy  Cardiovascular: Normal rate, regular rhythm and normal heart sounds.  Pulmonary/Chest: Effort normal and breath sounds normal.  No wheezes. No rales.       Assessment & Plan:

## 2012-04-18 NOTE — Assessment & Plan Note (Signed)
41 year old white male with signs symptoms of left maxillary sinusitis. Treat with amoxicillin 875 mg twice daily for 10 days. Also use intranasal saline as directed.  Patient advised to call office if symptoms persist or worsen.

## 2012-10-06 IMAGING — CT CT HIP*R* W/O CM
2 of 3 series · 17 of 46 positions shown, 19 images · non-contrast
Comparison: 11/18/2010

CLINICAL DATA: Fracture

CT OF THE RIGHT HIP WITHOUT CONTRAST
TECHNIQUE: Multidetector CT imaging was performed according to the
standard protocol. Multiplanar CT image reconstructions were also
generated.

[Series 4: hip 2.0 b40f · axial · 0.46mm/px · z∈[-370,-180]mm · 14 of 111 slices shown, 16 images]
[im 8/111  soft-tissue]
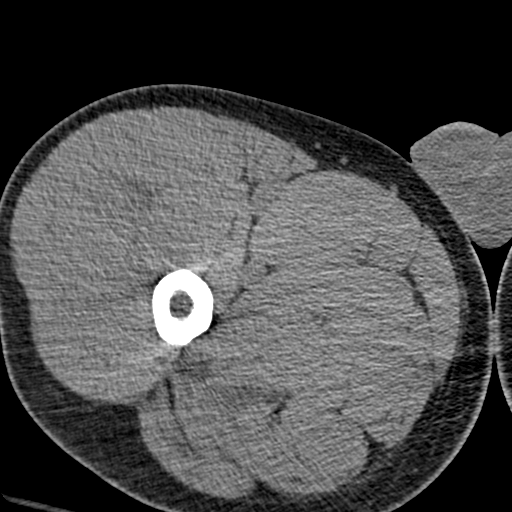
[im 8/111  bone]
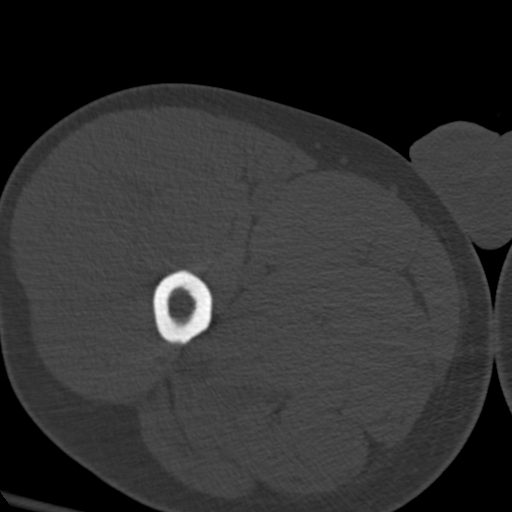
[im 15/111  soft-tissue]
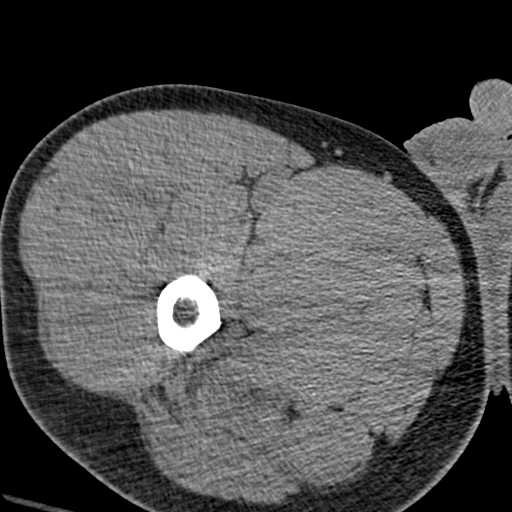
[im 22/111  soft-tissue]
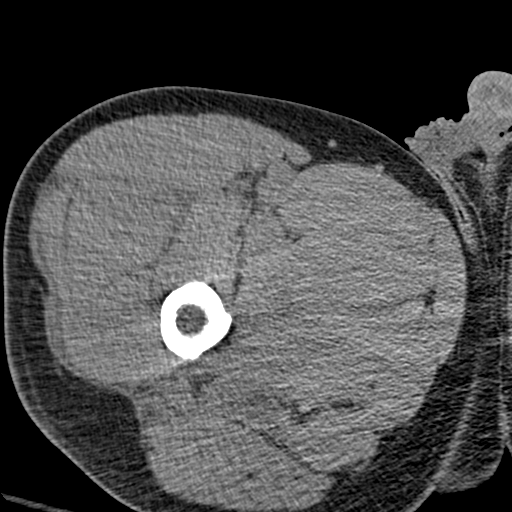
[im 29/111  soft-tissue]
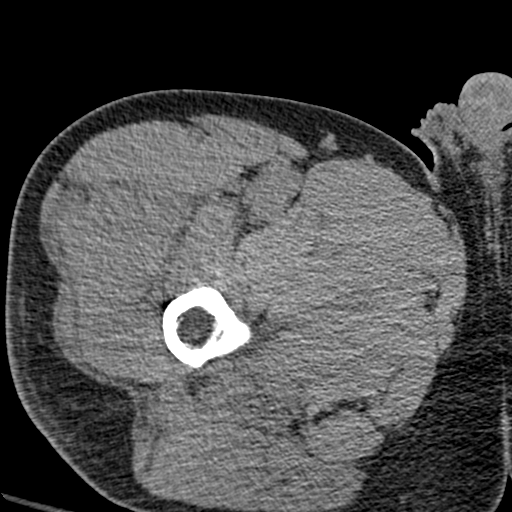
[im 36/111  soft-tissue]
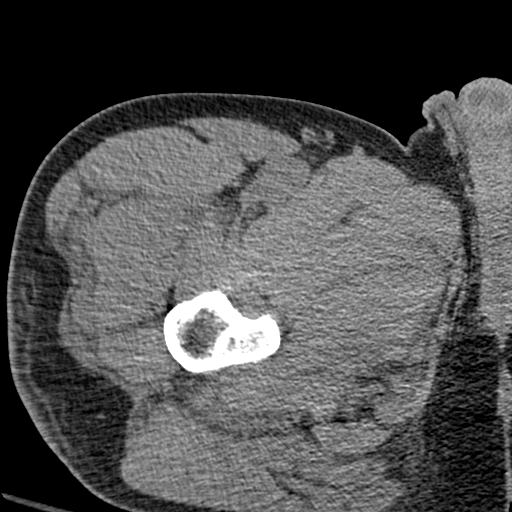
[im 43/111  soft-tissue]
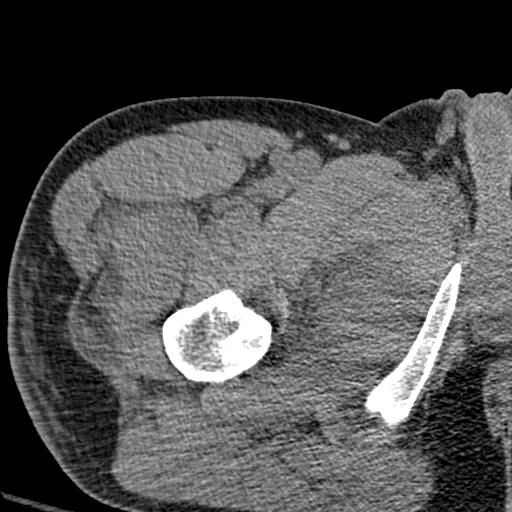
[im 50/111  soft-tissue]
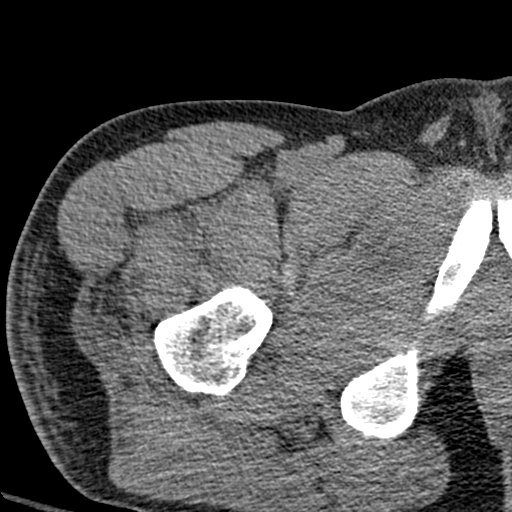
[im 61/111  soft-tissue]
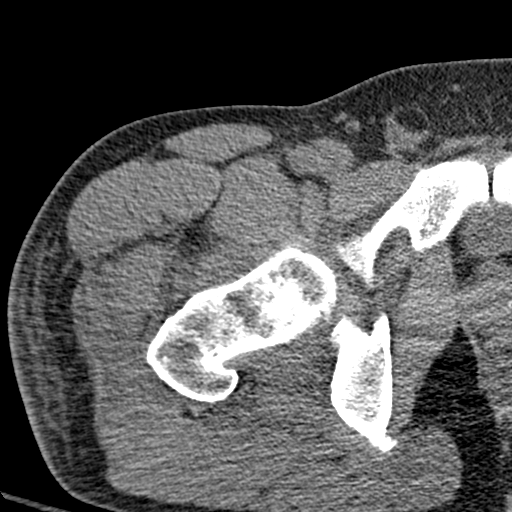
[im 68/111  soft-tissue]
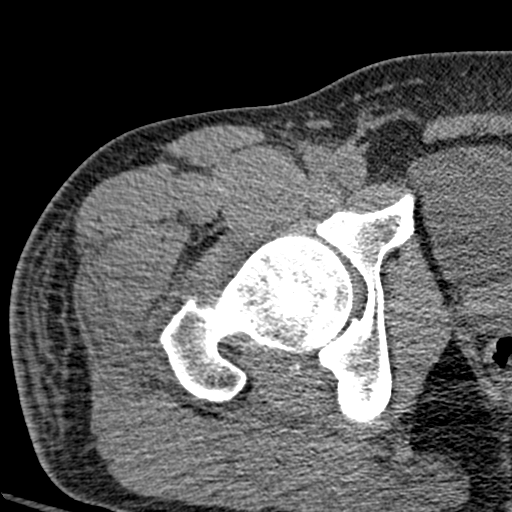
[im 68/111  bone]
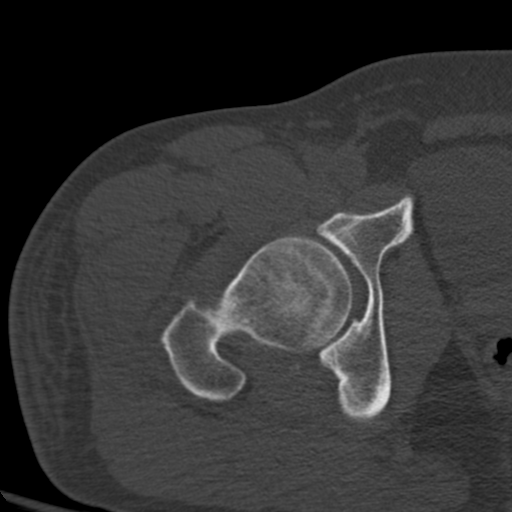
[im 75/111  soft-tissue]
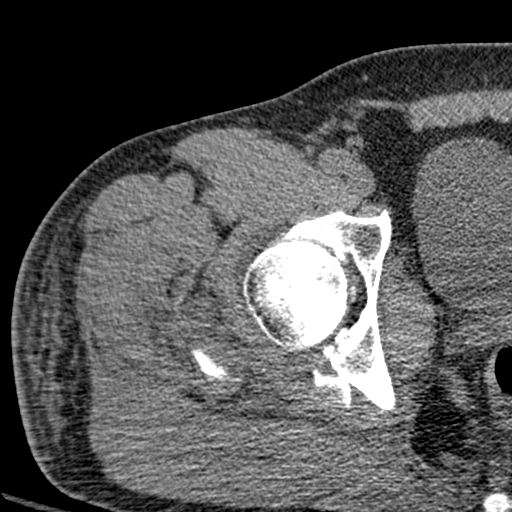
[im 82/111  soft-tissue]
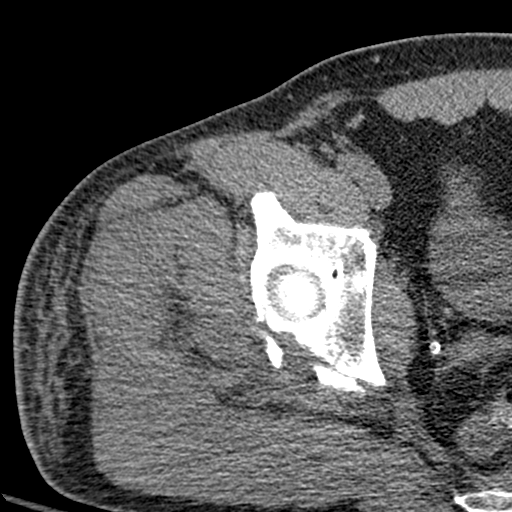
[im 89/111  soft-tissue]
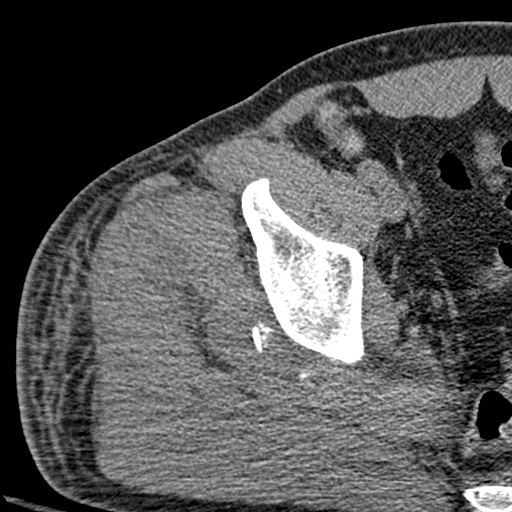
[im 96/111  soft-tissue]
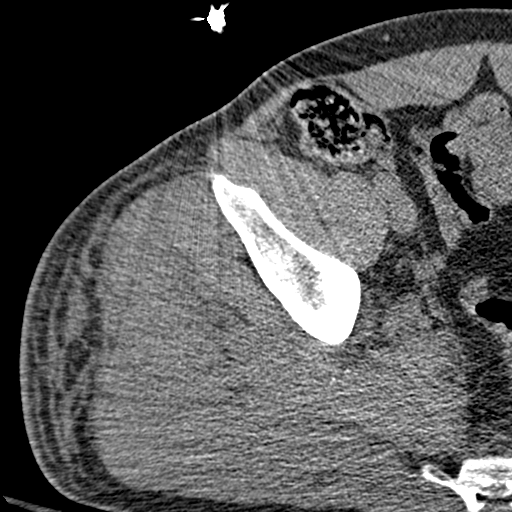
[im 103/111  soft-tissue]
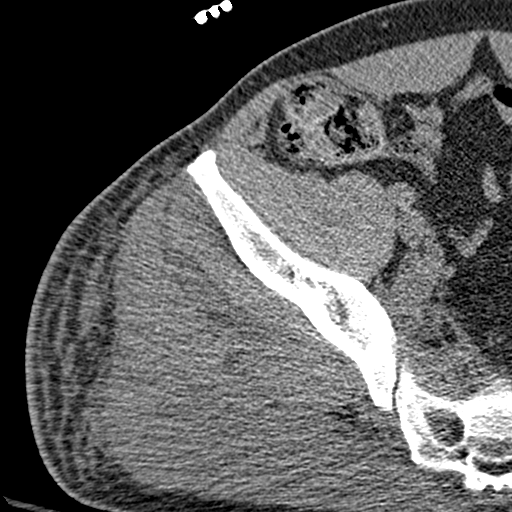

[Series 603: cor · coronal · 0.46mm/px · 3 of 77 slices shown]
[im 26/77  soft-tissue]
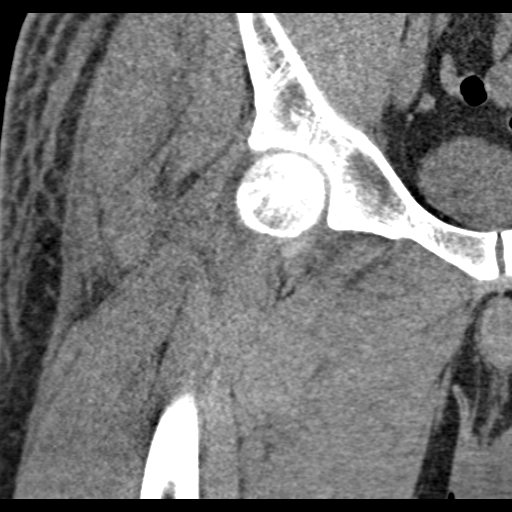
[im 34/77  soft-tissue]
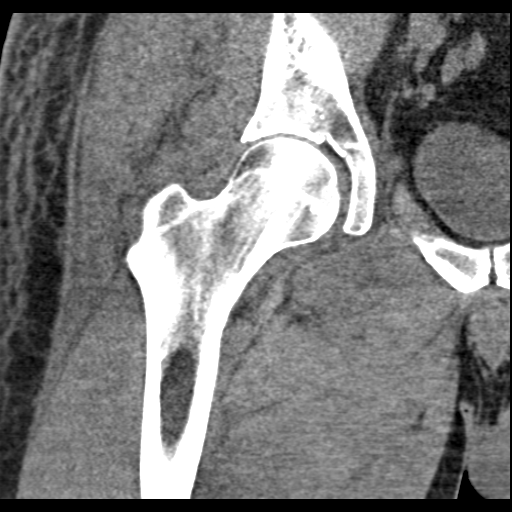
[im 43/77  soft-tissue]
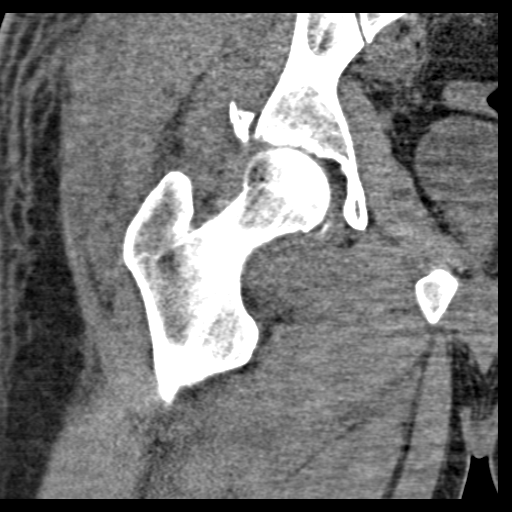

[17 of 46 positions shown; findings below may reference images not displayed]

FINDINGS: The right femoral head is relocated within the acetabular
socket.

There is a linear nondisplaced fracture through the roof of the
acetabulum extending from anterior to posterior.  The posterior
wall of the acetabulum is fragmented and bony fragments are
posterior to the femoral head.

There is a solitary 7 mm bony fragment within the hip joint
centrally located on image 35.  It is adjacent to the fovea of the
femoral head.

Femur is intact.
IMPRESSION: Anatomic reduction of the femoral head.

Posterior wall of the acetabulum is fragmented.  Linear fracture
through the roof is also noted.

Solitary 7 mm bone fragment in the hip joint.

## 2012-10-10 IMAGING — CT CT HEAD W/O CM
1 series · 16 of 30 positions shown, 20 images · non-contrast
Comparison: 11/18/2010

CLINICAL DATA: Closed head injury.  Left frontal headache.

CT HEAD WITHOUT CONTRAST
TECHNIQUE: Contiguous axial images were obtained from the base of
the skull through the vertex without contrast.

[Series 2: head routine 4.8 h37s · axial · 0.43mm/px · z∈[-173,-18]mm · 16 of 36 slices shown, 20 images]
[im 2/36  brain]
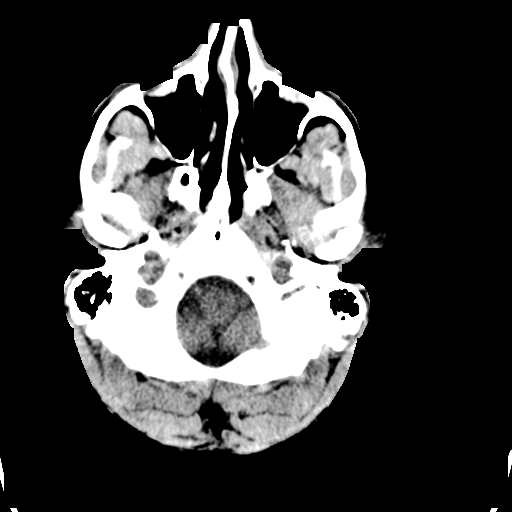
[im 2/36  bone]
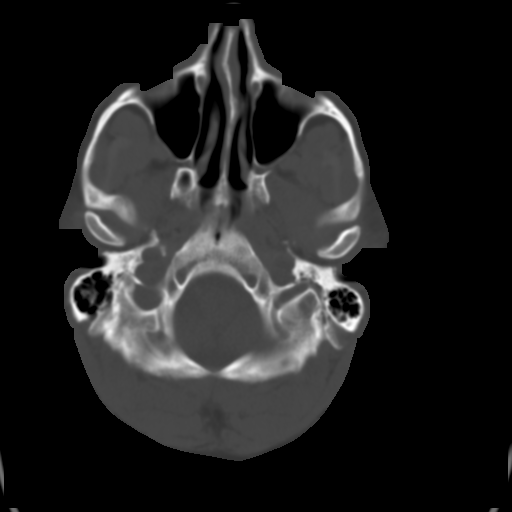
[im 4/36  brain]
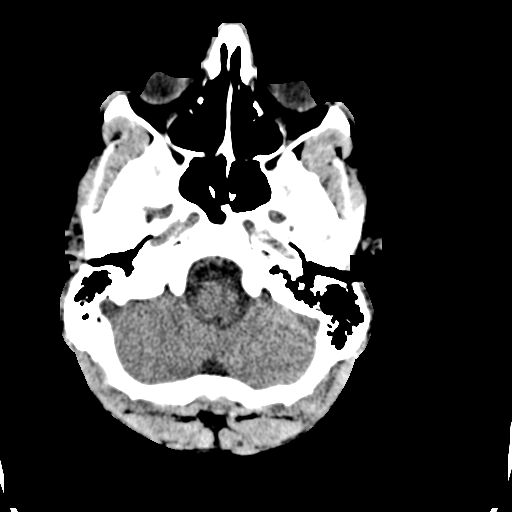
[im 7/36  brain]
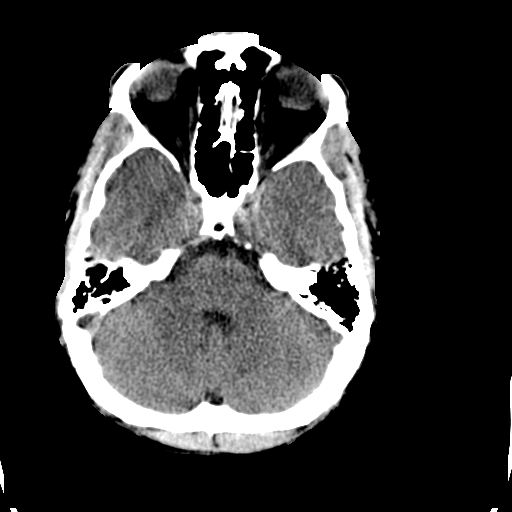
[im 9/36  brain]
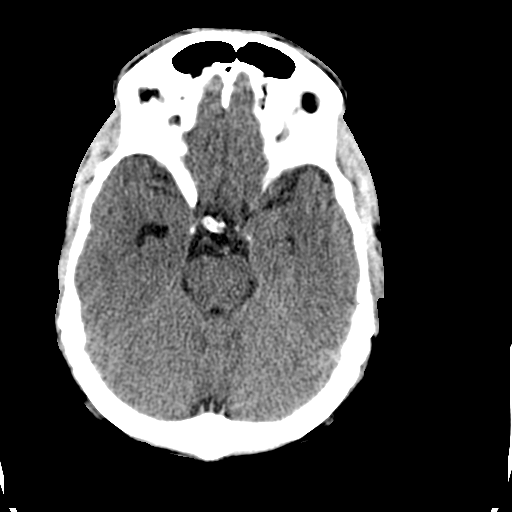
[im 10/36  brain]
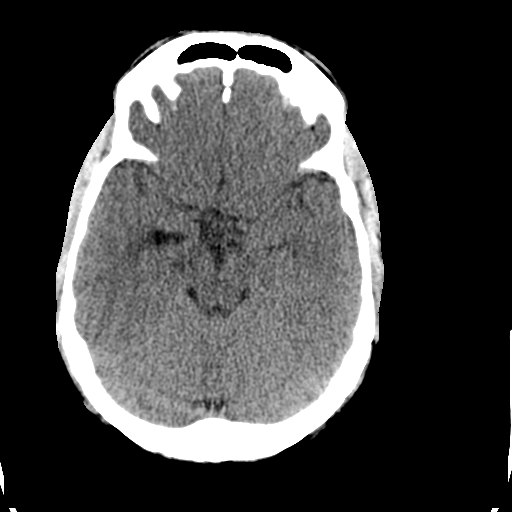
[im 10/36  bone]
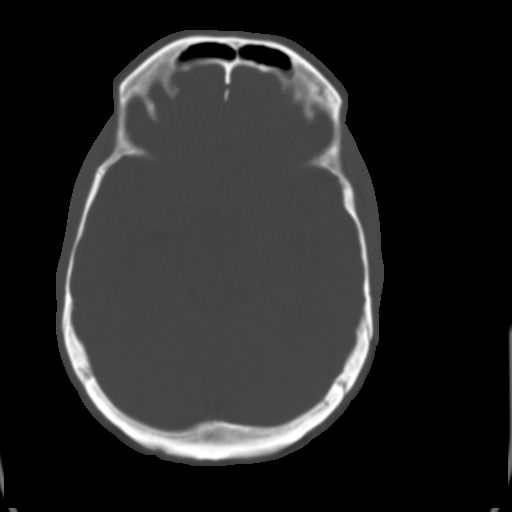
[im 13/36  brain]
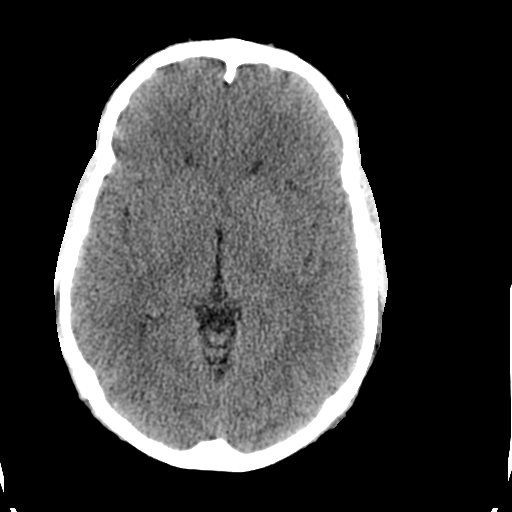
[im 15/36  brain]
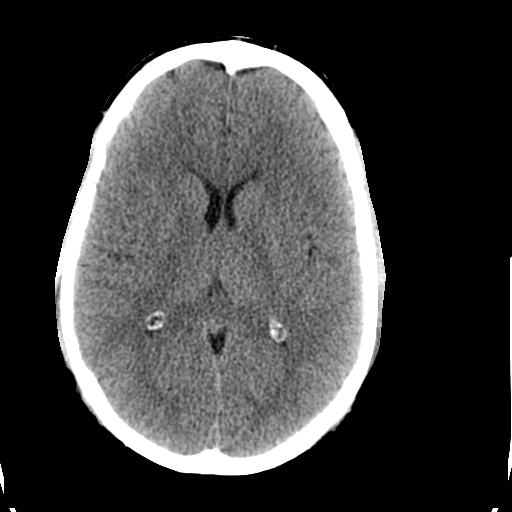
[im 17/36  brain]
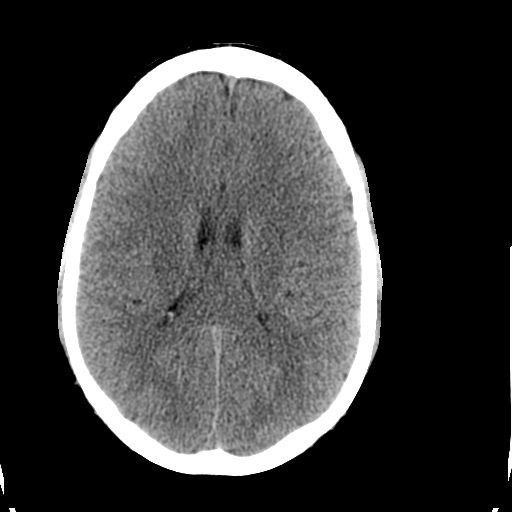
[im 19/36  brain]
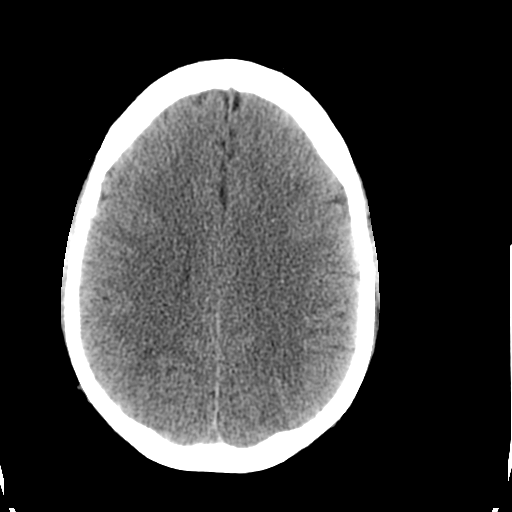
[im 19/36  bone]
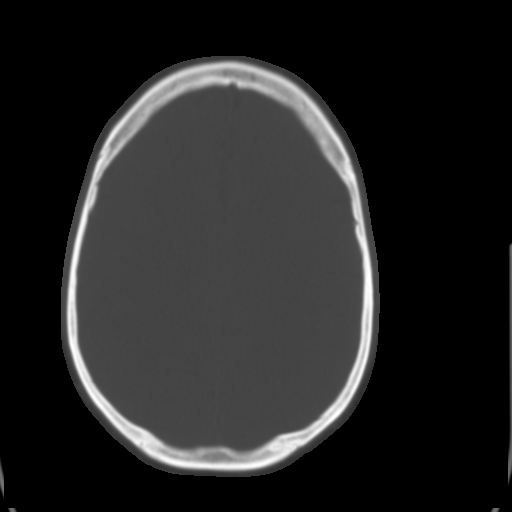
[im 21/36  brain]
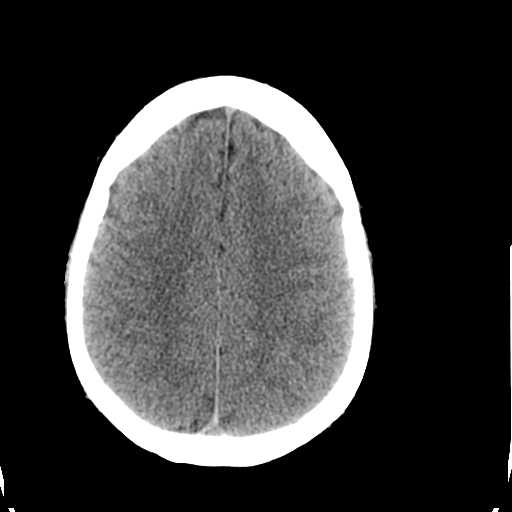
[im 23/36  brain]
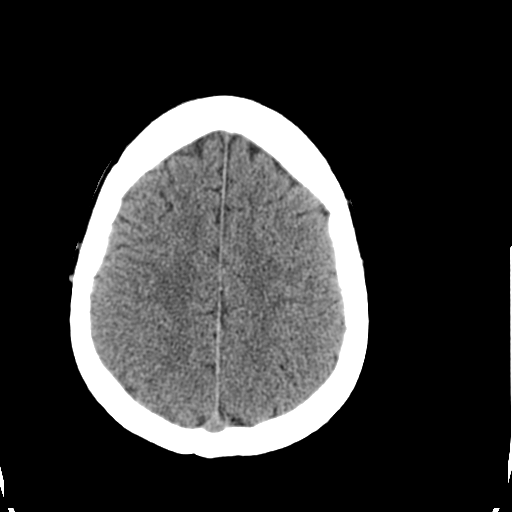
[im 26/36  brain]
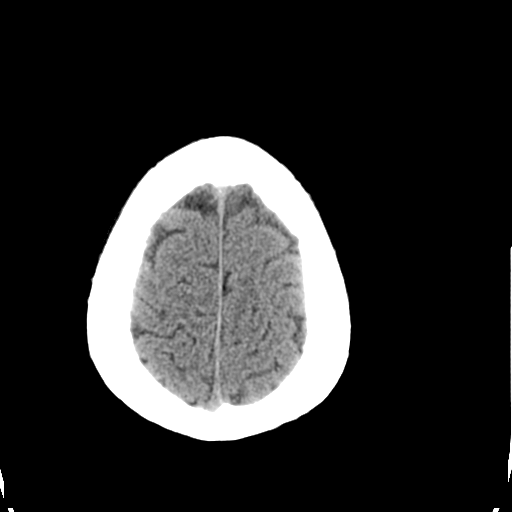
[im 27/36  brain]
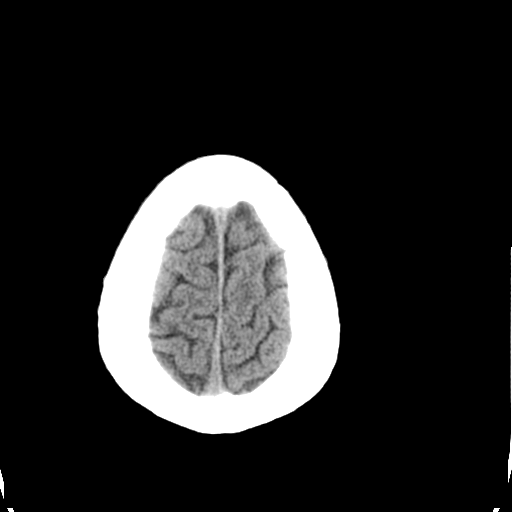
[im 27/36  bone]
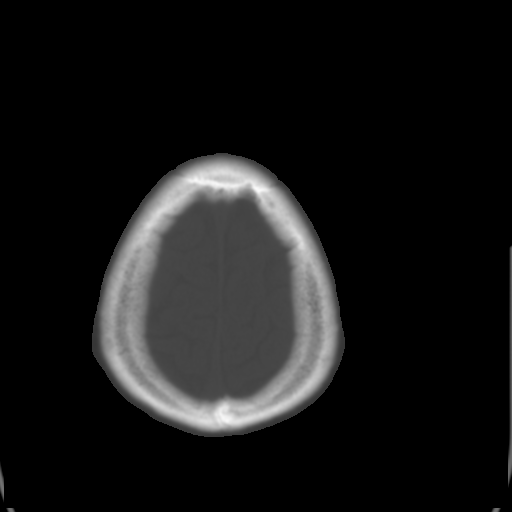
[im 29/36  brain]
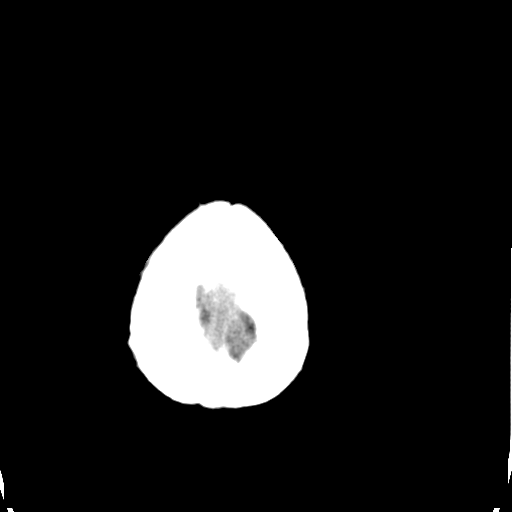
[im 32/36  brain]
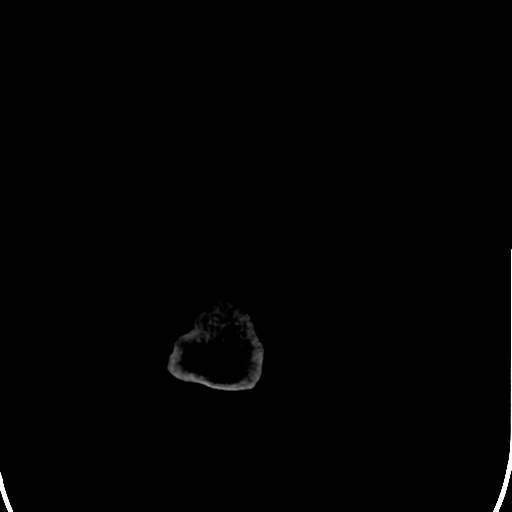
[im 34/36  brain]
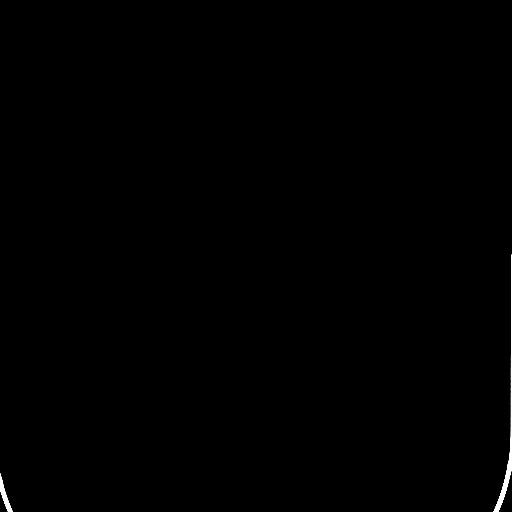

[16 of 30 positions shown; findings below may reference images not displayed]

FINDINGS: There is no evidence for acute infarction, intracranial
hemorrhage, mass lesion, hydrocephalus, or extra-axial fluid.
There is no atrophy or white matter disease.  Calvarium is intact.
IMPRESSION: Negative exam.  No change from priors.

## 2012-11-18 ENCOUNTER — Ambulatory Visit (INDEPENDENT_AMBULATORY_CARE_PROVIDER_SITE_OTHER): Payer: BC Managed Care – PPO | Admitting: Internal Medicine

## 2012-11-18 ENCOUNTER — Encounter: Payer: Self-pay | Admitting: Internal Medicine

## 2012-11-18 VITALS — BP 126/80 | HR 76 | Temp 98.1°F | Ht 74.0 in | Wt 202.0 lb

## 2012-11-18 DIAGNOSIS — Z0000 Encounter for general adult medical examination without abnormal findings: Secondary | ICD-10-CM

## 2012-11-18 LAB — CBC WITH DIFFERENTIAL/PLATELET
Basophils Relative: 0.8 % (ref 0.0–3.0)
Eosinophils Absolute: 0 10*3/uL (ref 0.0–0.7)
Eosinophils Relative: 0 % (ref 0.0–5.0)
HCT: 44.5 % (ref 39.0–52.0)
Lymphs Abs: 2 10*3/uL (ref 0.7–4.0)
MCHC: 34.1 g/dL (ref 30.0–36.0)
MCV: 90.6 fl (ref 78.0–100.0)
Monocytes Absolute: 0.8 10*3/uL (ref 0.1–1.0)
Neutro Abs: 4.6 10*3/uL (ref 1.4–7.7)
RBC: 4.91 Mil/uL (ref 4.22–5.81)
WBC: 7.5 10*3/uL (ref 4.5–10.5)

## 2012-11-18 LAB — HEPATIC FUNCTION PANEL
ALT: 37 U/L (ref 0–53)
AST: 26 U/L (ref 0–37)
Albumin: 4.3 g/dL (ref 3.5–5.2)
Total Bilirubin: 0.5 mg/dL (ref 0.3–1.2)

## 2012-11-18 LAB — BASIC METABOLIC PANEL
BUN: 12 mg/dL (ref 6–23)
Chloride: 103 mEq/L (ref 96–112)
Creatinine, Ser: 0.9 mg/dL (ref 0.4–1.5)
GFR: 98.02 mL/min (ref >60.00)
Glucose, Bld: 85 mg/dL (ref 70–99)
Potassium: 3.7 mEq/L (ref 3.5–5.1)

## 2012-11-18 LAB — TSH: TSH: 1.59 u[IU]/mL (ref 0.35–5.50)

## 2012-11-18 LAB — LIPID PANEL: Cholesterol: 132 mg/dL (ref 0–200)
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
## 2014-08-23 ENCOUNTER — Telehealth: Payer: Self-pay | Admitting: Internal Medicine

## 2014-08-23 NOTE — Telephone Encounter (Signed)
Error

## 2014-08-25 ENCOUNTER — Encounter: Payer: Self-pay | Admitting: Family Medicine

## 2014-08-25 ENCOUNTER — Ambulatory Visit (INDEPENDENT_AMBULATORY_CARE_PROVIDER_SITE_OTHER): Payer: BC Managed Care – PPO | Admitting: Family Medicine

## 2014-08-25 VITALS — BP 124/78 | HR 76 | Temp 97.8°F | Ht 74.0 in | Wt 207.0 lb

## 2014-08-25 DIAGNOSIS — F329 Major depressive disorder, single episode, unspecified: Secondary | ICD-10-CM

## 2014-08-25 DIAGNOSIS — F32A Depression, unspecified: Secondary | ICD-10-CM

## 2014-08-25 MED ORDER — CITALOPRAM HYDROBROMIDE 10 MG PO TABS: 10.0000 mg | ORAL_TABLET | Freq: Every day | ORAL | Status: DC

## 2014-08-25 MED ORDER — LEVOCETIRIZINE DIHYDROCHLORIDE 2.5 MG/5ML PO SOLN: 2.5000 mg | Freq: Every evening | ORAL | Status: DC

## 2014-08-25 NOTE — Progress Notes (Signed)
   Subjective:    Patient ID: Jonathan Costa, male    DOB: 11-Dec-1970, 43 y.o.   MRN: [id number]  HPI Here asking for a rx for Celexa. He took 10 mg a day of Celexa about 5 years ago for one year, and this helped quite a bit. Lately he has been stressed with work, with travelling to his teenage daughter's soccer tournaments, and with the upcoming Christmas season. He feels mildly depressed and has low energy. His appetite and sleep are preserved.    Review of Systems  Constitutional: Negative.   Psychiatric/Behavioral: Positive for dysphoric mood. Negative for suicidal ideas, hallucinations, behavioral problems, confusion, sleep disturbance, self-injury, decreased concentration and agitation. The patient is nervous/anxious. The patient is not hyperactive.        Objective:   Physical Exam  Constitutional: He is oriented to person, place, and time. He appears well-developed and well-nourished.  Neurological: He is alert and oriented to person, place, and time.  Psychiatric: He has a normal mood and affect. His behavior is normal. Thought content normal.          Assessment & Plan:  Start back on Celexa 10 mg daily. Recheck prn

## 2014-08-25 NOTE — Progress Notes (Signed)
Pre visit review using our clinic review tool, if applicable. No additional management support is needed unless otherwise documented below in the visit note. 

## 2014-12-24 ENCOUNTER — Ambulatory Visit: Payer: Self-pay | Admitting: Family Medicine

## 2014-12-24 DIAGNOSIS — Z0289 Encounter for other administrative examinations: Secondary | ICD-10-CM

## 2015-05-13 ENCOUNTER — Ambulatory Visit (INDEPENDENT_AMBULATORY_CARE_PROVIDER_SITE_OTHER): Payer: BLUE CROSS/BLUE SHIELD | Admitting: Family Medicine

## 2015-05-13 ENCOUNTER — Encounter: Payer: Self-pay | Admitting: Family Medicine

## 2015-05-13 VITALS — BP 132/88 | HR 73 | Temp 98.4°F | Wt 210.0 lb

## 2015-05-13 DIAGNOSIS — B029 Zoster without complications: Secondary | ICD-10-CM

## 2015-05-13 DIAGNOSIS — Z0000 Encounter for general adult medical examination without abnormal findings: Secondary | ICD-10-CM

## 2015-05-13 DIAGNOSIS — F32A Depression, unspecified: Secondary | ICD-10-CM

## 2015-05-13 DIAGNOSIS — R040 Epistaxis: Secondary | ICD-10-CM

## 2015-05-13 DIAGNOSIS — R42 Dizziness and giddiness: Secondary | ICD-10-CM | POA: Insufficient documentation

## 2015-05-13 DIAGNOSIS — J302 Other seasonal allergic rhinitis: Secondary | ICD-10-CM | POA: Diagnosis not present

## 2015-05-13 DIAGNOSIS — F329 Major depressive disorder, single episode, unspecified: Secondary | ICD-10-CM | POA: Diagnosis not present

## 2015-05-13 DIAGNOSIS — J3089 Other allergic rhinitis: Secondary | ICD-10-CM
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
## 2015-05-13 NOTE — Patient Instructions (Signed)
Medication Instructions:  Same, see list  Labwork: Please drop a copy off from [facility] so I can review  Testing/Procedures/Immunizations: Advise flu shot in sept-nov  Follow-Up (all visit scheduling, rescheduling, cancellations including labs should be scheduled at front desk): 1 year for physical or sooner if you need [place].

## 2015-05-14 DIAGNOSIS — R040 Epistaxis: Secondary | ICD-10-CM | POA: Insufficient documentation

## 2015-08-10 ENCOUNTER — Other Ambulatory Visit: Payer: Self-pay | Admitting: Family Medicine

## 2016-06-12 ENCOUNTER — Encounter: Payer: Self-pay | Admitting: Family Medicine

## 2016-06-12 LAB — BASIC METABOLIC PANEL: GLUCOSE: 74 mg/dL

## 2016-06-12 LAB — HEMOGLOBIN A1C: HEMOGLOBIN A1C: 5.5

## 2016-06-12 LAB — LIPID PANEL
CHOLESTEROL: 144 mg/dL (ref 0–200)
HDL: 42 mg/dL (ref 35–70)
Triglycerides: 94 mg/dL (ref 40–160)

## 2016-06-12 LAB — HEPATIC FUNCTION PANEL
ALT: 11 U/L (ref 10–40)
AST: 15 U/L (ref 14–40)
Alkaline Phosphatase: 116 U/L (ref 25–125)
BILIRUBIN, TOTAL: 0.4 mg/dL
Bilirubin, Direct: 0.1 mg/dL

## 2016-06-16 LAB — LIPID PANEL: LDL CALC: 83 mg/dL

## 2016-08-30 ENCOUNTER — Other Ambulatory Visit: Payer: Self-pay | Admitting: Family Medicine

## 2016-08-30 NOTE — Telephone Encounter (Signed)
Patient needs an appointment prior to refill. Hasn't been seen since last year.

## 2016-10-02 ENCOUNTER — Other Ambulatory Visit: Payer: Self-pay | Admitting: Emergency Medicine

## 2016-10-02 ENCOUNTER — Telehealth: Payer: Self-pay | Admitting: Emergency Medicine

## 2016-10-02 ENCOUNTER — Ambulatory Visit (INDEPENDENT_AMBULATORY_CARE_PROVIDER_SITE_OTHER): Payer: Self-pay | Admitting: Family Medicine

## 2016-10-02 DIAGNOSIS — Z0289 Encounter for other administrative examinations: Secondary | ICD-10-CM

## 2016-10-02 NOTE — Telephone Encounter (Signed)
Called and spoke with pt regarding his appointment that was scheduled for his annual exam today. Pt states that he was in [place] and couldn't make his appointment. I informed pt that a no show charge has been applied. Pt verbalized understanding and has scheduled another  appointment for 10/12/16

## 2016-10-12 ENCOUNTER — Ambulatory Visit: Payer: Self-pay | Admitting: Family Medicine

## 2016-10-16 ENCOUNTER — Ambulatory Visit (INDEPENDENT_AMBULATORY_CARE_PROVIDER_SITE_OTHER): Payer: BLUE CROSS/BLUE SHIELD | Admitting: Family Medicine

## 2016-10-16 ENCOUNTER — Encounter: Payer: Self-pay | Admitting: Family Medicine

## 2016-10-16 VITALS — BP 116/76 | HR 89 | Temp 98.2°F | Ht 73.25 in | Wt 211.2 lb

## 2016-10-16 DIAGNOSIS — E611 Iron deficiency: Secondary | ICD-10-CM | POA: Diagnosis not present

## 2016-10-16 DIAGNOSIS — Z0001 Encounter for general adult medical examination with abnormal findings: Secondary | ICD-10-CM

## 2016-10-16 DIAGNOSIS — K4090 Unilateral inguinal hernia, without obstruction or gangrene, not specified as recurrent: Secondary | ICD-10-CM

## 2016-10-16 DIAGNOSIS — M26609 Unspecified temporomandibular joint disorder, unspecified side: Secondary | ICD-10-CM

## 2016-10-16 MED ORDER — METHOCARBAMOL 500 MG PO TABS: 500.0000 mg | ORAL_TABLET | Freq: Three times a day (TID) | ORAL | 2 refills | Status: DC | PRN

## 2016-10-16 MED ORDER — CITALOPRAM HYDROBROMIDE 10 MG PO TABS: ORAL_TABLET | ORAL | 3 refills | Status: DC

## 2016-10-16 NOTE — Assessment & Plan Note (Signed)
Methocarbamol helps in addition to mouthguard- about 2x a week- refilled today

## 2016-10-16 NOTE — Progress Notes (Signed)
Pre visit review using our clinic review tool, if applicable. No additional management support is needed unless otherwise documented below in the visit note. 

## 2016-10-16 NOTE — Patient Instructions (Addendum)
Refilled methocarbamol  Seems like the common cold. If fever or shortness of breath return to see [place]  1 year physical, 6 weeks schedule a lab visit to check on iron stores- if getting lower would likely refer to GI for colonoscopy early.   I like your weight goal of 195. Lets work towards that over next year

## 2016-10-16 NOTE — Assessment & Plan Note (Signed)
Depression controlled on 10mg  celexa. Has tried off and didn't feel fully like himself- felt less productive
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
## 2016-10-16 NOTE — Assessment & Plan Note (Signed)
O: bulge in right groin worsens with valsalva A/P: hernia Right side. Discussed possible surgical intervention- not really bothering him (only if coughs so does not want to pursue surgery at present) may reach out at later date for surgical referral- emergent indications for evaluation discussed.

## 2016-10-16 NOTE — Assessment & Plan Note (Signed)
Labs from work show iron 30, ferritin trending over last few years from 450s down to 190 or so. He does have intermittent rectal bleeding- thought to be hemorrhoid related. Had a colonoscopy around age 46. We discussed if still trending down would consider repeat colonoscopy.

## 2016-11-26 ENCOUNTER — Other Ambulatory Visit: Payer: BLUE CROSS/BLUE SHIELD

## 2017-02-11 ENCOUNTER — Other Ambulatory Visit: Payer: Self-pay | Admitting: Family Medicine

## 2017-02-11 DIAGNOSIS — M26609 Unspecified temporomandibular joint disorder, unspecified side: Secondary | ICD-10-CM

## 2017-05-21 ENCOUNTER — Encounter: Payer: Self-pay | Admitting: General Surgery

## 2017-05-21 ENCOUNTER — Ambulatory Visit (INDEPENDENT_AMBULATORY_CARE_PROVIDER_SITE_OTHER): Payer: BLUE CROSS/BLUE SHIELD | Admitting: General Surgery

## 2017-05-21 VITALS — BP 141/84 | HR 68 | Temp 98.2°F | Resp 18 | Ht 74.0 in | Wt 215.0 lb

## 2017-05-21 DIAGNOSIS — K4090 Unilateral inguinal hernia, without obstruction or gangrene, not specified as recurrent: Secondary | ICD-10-CM | POA: Diagnosis not present
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
## 2017-05-21 NOTE — Patient Instructions (Signed)

## 2017-05-21 NOTE — Patient Instructions (Signed)
Jonathan Costa  05/21/2017     @[email or web address] @   Your procedure is scheduled on  05/24/2017 .  Report to [facility] at  700  A.M.  Call this number if you have problems the morning of surgery:  [phone]   Remember:  Do not eat food or drink liquids after midnight.  Take these medicines the morning of surgery with A SIP OF WATER  Celexa, robaxin, singulair.   Do not wear jewelry, make-up or nail polish.  Do not wear lotions, powders, or perfumes, or deoderant.  Do not shave 48 hours prior to surgery.  Men may shave face and neck.  Do not bring valuables to the hospital.  [facility] is not responsible for any belongings or valuables.  Contacts, dentures or bridgework may not be worn into surgery.  Leave your suitcase in the car.  After surgery it may be brought to your room.  For patients admitted to the hospital, discharge time will be determined by your treatment team.  Patients discharged the day of surgery will not be allowed to drive home.   Name and phone number of your driver:   family Special instructions:  None  Please read over the following fact sheets that you were given. Anesthesia Post-op Instructions and Care and Recovery After Surgery       Open Hernia Repair, Adult Open hernia repair is a surgical procedure to fix a hernia. A hernia occurs when an internal organ or tissue pushes out through a weak spot in the abdominal wall muscles. Hernias commonly occur in the groin and around the navel. Most hernias tend to get worse over time. Often, surgery is done to prevent the hernia from becoming bigger, uncomfortable, or an emergency. Emergency surgery may be needed if abdominal contents get stuck in the opening (incarcerated hernia) or the blood supply gets cut off (strangulated hernia). In an open repair, an incision is made in the abdomen to perform the surgery. Tell a health care provider about:  Any allergies you have.  All  medicines you are taking, including vitamins, herbs, eye drops, creams, and over-the-counter medicines.  Any problems you or family members have had with anesthetic medicines.  Any blood or bone disorders you have.  Any surgeries you have had.  Any medical conditions you have, including any recent cold or flu symptoms.  Whether you are pregnant or may be pregnant. What are the risks? Generally, this is a safe procedure. However, problems may occur, including:  Long-lasting (chronic) pain.  Bleeding.  Infection.  Damage to the testicle. This can cause shrinking or swelling.  Damage to the bladder, blood vessels, intestine, or nerves near the hernia.  Trouble passing urine.  Allergic reactions to medicines.  Return of the hernia.  What happens before the procedure? Staying hydrated Follow instructions from your health care provider about hydration, which may include:  Up to 2 hours before the procedure - you may continue to drink clear liquids, such as water, clear fruit juice, black coffee, and plain tea.  Eating and drinking restrictions Follow instructions from your health care provider about eating and drinking, which may include:  8 hours before the procedure - stop eating heavy meals or foods such as meat, fried foods, or fatty foods.  6 hours before the procedure - stop eating light meals or foods, such as toast or cereal.  6 hours before the procedure - stop drinking milk or drinks  that contain milk.  2 hours before the procedure - stop drinking clear liquids.  Medicines  Ask your health care provider about: ? Changing or stopping your regular medicines. This is especially important if you are taking diabetes medicines or blood thinners. ? Taking medicines such as aspirin and ibuprofen. These medicines can thin your blood. Do not take these medicines before your procedure if your health care provider instructs you not to.  You may be given antibiotic  medicine to help prevent infection. General instructions  You may have blood tests or imaging studies.  Ask your health care provider how your surgical site will be marked or identified.  If you smoke, do not smoke for at least 2 weeks before your procedure or for as long as told by your health care provider.  Let your health care provider know if you develop a cold or any infection before your surgery.  Plan to have someone take you home from the hospital or clinic.  If you will be going home right after the procedure, plan to have someone with you for 24 hours. What happens during the procedure?  To reduce your risk of infection: ? Your health care team will wash or sanitize their hands. ? Your skin will be washed with soap. ? Hair may be removed from the surgical area.  An IV tube will be inserted into one of your veins.  You will be given one or more of the following: ? A medicine to help you relax (sedative). ? A medicine to numb the area (local anesthetic). ? A medicine to make you fall asleep (general anesthetic).  Your surgeon will make an incision over the hernia.  The tissues of the hernia will be moved back into place.  The edges of the hernia may be stitched together.  The opening in the abdominal muscles will be closed with stitches (sutures). Or, your surgeon will place a mesh patch made of manmade (synthetic) material over the opening.  The incision will be closed.  A bandage (dressing) may be placed over the incision. The procedure may vary among health care providers and hospitals. What happens after the procedure?  Your blood pressure, heart rate, breathing rate, and blood oxygen level will be monitored until the medicines you were given have worn off.  You may be given medicine for pain.  Do not drive for 24 hours if you received a sedative. This information is not intended to replace advice given to you by your health care provider. Make sure you  discuss any questions you have with your health care provider. Document Released: 03/06/2001 Document Revised: 03/30/2016 Document Reviewed: 02/22/2016 Elsevier Interactive Patient Education  2018 [place], Adult, Care After These instructions give you information about caring for yourself after your procedure. Your doctor may also give you more specific instructions. If you have problems or questions, contact your doctor. Follow these instructions at home: Surgical cut (incision) care   Follow instructions from your doctor about how to take care of your surgical cut area. Make sure you: ? Wash your hands with soap and water before you change your bandage (dressing). If you cannot use soap and water, use hand sanitizer. ? Change your bandage as told by your doctor. ? Leave stitches (sutures), skin glue, or skin tape (adhesive) strips in place. They may need to stay in place for 2 weeks or longer. If tape strips get loose and curl up, you may trim the loose edges.  Do not remove tape strips completely unless your doctor says it is okay.  Check your surgical cut every day for signs of infection. Check for: ? More redness, swelling, or pain. ? More fluid or blood. ? Warmth. ? Pus or a bad smell. Activity  Do not drive or use heavy machinery while taking prescription pain medicine. Do not drive until your doctor says it is okay.  Until your doctor says it is okay: ? Do not lift anything that is heavier than 10 lb (4.5 kg). ? Do not play contact sports.  Return to your normal activities as told by your doctor. Ask your doctor what activities are safe. General instructions  To prevent or treat having a hard time pooping (constipation) while you are taking prescription pain medicine, your doctor may recommend that you: ? Drink enough fluid to keep your pee (urine) clear or pale yellow. ? Take over-the-counter or prescription medicines. ? Eat foods that are high in  fiber, such as fresh fruits and vegetables, whole grains, and beans. ? Limit foods that are high in fat and processed sugars, such as fried and sweet foods.  Take over-the-counter and prescription medicines only as told by your doctor.  Do not take baths, swim, or use a hot tub until your doctor says it is okay.  Keep all follow-up visits as told by your doctor. This is important. Contact a doctor if:  You develop a rash.  You have more redness, swelling, or pain around your surgical cut.  You have more fluid or blood coming from your surgical cut.  Your surgical cut feels warm to the touch.  You have pus or a bad smell coming from your surgical cut.  You have a fever or chills.  You have blood in your poop (stool).  You have not pooped in 2-3 days.  Medicine does not help your pain. Get help right away if:  You have chest pain or you are short of breath.  You feel light-headed.  You feel weak and dizzy (feel faint).  You have very bad pain.  You throw up (vomit) and your pain is worse. This information is not intended to replace advice given to you by your health care provider. Make sure you discuss any questions you have with your health care provider. Document Released: 10/01/2014 Document Revised: 03/30/2016 Document Reviewed: 02/22/2016 Elsevier Interactive Patient Education  2017 [place] Anesthesia, Adult General anesthesia is the use of medicines to make a person "go to sleep" (be unconscious) for a medical procedure. General anesthesia is often recommended when a procedure:  Is long.  Requires you to be still or in an unusual position.  Is major and can cause you to lose blood.  Is impossible to do without general anesthesia.  The medicines used for general anesthesia are called general anesthetics. In addition to making you sleep, the medicines:  Prevent pain.  Control your blood pressure.  Relax your muscles.  Tell a health care  provider about:  Any allergies you have.  All medicines you are taking, including vitamins, herbs, eye drops, creams, and over-the-counter medicines.  Any problems you or family members have had with anesthetic medicines.  Types of anesthetics you have had in the past.  Any bleeding disorders you have.  Any surgeries you have had.  Any medical conditions you have.  Any history of heart or lung conditions, such as heart failure, sleep apnea, or chronic obstructive pulmonary disease (COPD).  Whether you are  pregnant or may be pregnant.  Whether you use tobacco, alcohol, marijuana, or street drugs.  Any history of Armed forces logistics/support/administrative officer.  Any history of depression or anxiety. What are the risks? Generally, this is a safe procedure. However, problems may occur, including:  Allergic reaction to anesthetics.  Lung and heart problems.  Inhaling food or liquids from your stomach into your lungs (aspiration).  Injury to nerves.  Waking up during your procedure and being unable to move (rare).  Extreme agitation or a state of mental confusion (delirium) when you wake up from the anesthetic.  Air in the bloodstream, which can lead to stroke.  These problems are more likely to develop if you are having a major surgery or if you have an advanced medical condition. You can prevent some of these complications by answering all of your health care provider's questions thoroughly and by following all pre-procedure instructions. General anesthesia can cause side effects, including:  Nausea or vomiting  A sore throat from the breathing tube.  Feeling cold or shivery.  Feeling tired, washed out, or achy.  Sleepiness or drowsiness.  Confusion or agitation.  What happens before the procedure? Staying hydrated Follow instructions from your health care provider about hydration, which may include:  Up to 2 hours before the procedure - you may continue to drink clear liquids, such as  water, clear fruit juice, black coffee, and plain tea.  Eating and drinking restrictions Follow instructions from your health care provider about eating and drinking, which may include:  8 hours before the procedure - stop eating heavy meals or foods such as meat, fried foods, or fatty foods.  6 hours before the procedure - stop eating light meals or foods, such as toast or cereal.  6 hours before the procedure - stop drinking milk or drinks that contain milk.  2 hours before the procedure - stop drinking clear liquids.  Medicines  Ask your health care provider about: ? Changing or stopping your regular medicines. This is especially important if you are taking diabetes medicines or blood thinners. ? Taking medicines such as aspirin and ibuprofen. These medicines can thin your blood. Do not take these medicines before your procedure if your health care provider instructs you not to. ? Taking new dietary supplements or medicines. Do not take these during the week before your procedure unless your health care provider approves them.  If you are told to take a medicine or to continue taking a medicine on the day of the procedure, take the medicine with sips of water. General instructions   Ask if you will be going home the same day, the following day, or after a longer hospital stay. ? Plan to have someone take you home. ? Plan to have someone stay with you for the first 24 hours after you leave the hospital or clinic.  For 3-6 weeks before the procedure, try not to use any tobacco products, such as cigarettes, chewing tobacco, and e-cigarettes.  You may brush your teeth on the morning of the procedure, but make sure to spit out the toothpaste. What happens during the procedure?  You will be given anesthetics through a mask and through an IV tube in one of your veins.  You may receive medicine to help you relax (sedative).  As soon as you are asleep, a breathing tube may be used to  help you breathe.  An anesthesia specialist will stay with you throughout the procedure. He or she will help keep you comfortable  and safe by continuing to give you medicines and adjusting the amount of medicine that you get. He or she will also watch your blood pressure, pulse, and oxygen levels to make sure that the anesthetics do not cause any problems.  If a breathing tube was used to help you breathe, it will be removed before you wake up. The procedure may vary among health care providers and hospitals. What happens after the procedure?  You will wake up, often slowly, after the procedure is complete, usually in a recovery area.  Your blood pressure, heart rate, breathing rate, and blood oxygen level will be monitored until the medicines you were given have worn off.  You may be given medicine to help you calm down if you feel anxious or agitated.  If you will be going home the same day, your health care provider may check to make sure you can stand, drink, and urinate.  Your health care providers will treat your pain and side effects before you go home.  Do not drive for 24 hours if you received a sedative.  You may: ? Feel nauseous and vomit. ? Have a sore throat. ? Have mental slowness. ? Feel cold or shivery. ? Feel sleepy. ? Feel tired. ? Feel sore or achy, even in parts of your body where you did not have surgery. This information is not intended to replace advice given to you by your health care provider. Make sure you discuss any questions you have with your health care provider. Document Released: 12/18/2007 Document Revised: 02/21/2016 Document Reviewed: 08/25/2015 Elsevier Interactive Patient Education  2018 [place] Anesthesia, Adult, Care After These instructions provide you with information about caring for yourself after your procedure. Your health care provider may also give you more specific instructions. Your treatment has been planned according  to current medical practices, but problems sometimes occur. Call your health care provider if you have any problems or questions after your procedure. What can I expect after the procedure? After the procedure, it is common to have:  Vomiting.  A sore throat.  Mental slowness.  It is common to feel:  Nauseous.  Cold or shivery.  Sleepy.  Tired.  Sore or achy, even in parts of your body where you did not have surgery.  Follow these instructions at home: For at least 24 hours after the procedure:  Do not: ? Participate in activities where you could fall or become injured. ? Drive. ? Use heavy machinery. ? Drink alcohol. ? Take sleeping pills or medicines that cause drowsiness. ? Make important decisions or sign legal documents. ? Take care of children on your own.  Rest. Eating and drinking  If you vomit, drink water, juice, or soup when you can drink without vomiting.  Drink enough fluid to keep your urine clear or pale yellow.  Make sure you have little or no nausea before eating solid foods.  Follow the diet recommended by your health care provider. General instructions  Have a responsible adult stay with you until you are awake and alert.  Return to your normal activities as told by your health care provider. Ask your health care provider what activities are safe for you.  Take over-the-counter and prescription medicines only as told by your health care provider.  If you smoke, do not smoke without supervision.  Keep all follow-up visits as told by your health care provider. This is important. Contact a health care provider if:  You continue to have nausea or  vomiting at home, and medicines are not helpful.  You cannot drink fluids or start eating again.  You cannot urinate after 8-12 hours.  You develop a skin rash.  You have fever.  You have increasing redness at the site of your procedure. Get help right away if:  You have difficulty  breathing.  You have chest pain.  You have unexpected bleeding.  You feel that you are having a life-threatening or urgent problem. This information is not intended to replace advice given to you by your health care provider. Make sure you discuss any questions you have with your health care provider. Document Released: 12/17/2000 Document Revised: 02/13/2016 Document Reviewed: 08/25/2015 Elsevier Interactive Patient Education  [employer].

## 2017-05-21 NOTE — Progress Notes (Signed)
Jonathan Costa; [id number]; Dec 07, 1970   HPI Patient is a 46 year old white male who presents with a right inguinal hernia. He has had it for many months, but is increasing in size and causing discomfort. He currently has a pain level of 510. It is made worse with straining. It does reduce on its own when lying down. Past Medical History:  Diagnosis Date  . Allergy   . DEPRESSION 07/03/2008  . SHINGLES 06/08/2010    Past Surgical History:  Procedure Laterality Date  . HIP SURGERY Right 2012   traumatic- "tree fell on me"    Family History  Problem Relation Age of Onset  . Cancer Mother 65       breast  . Hypertension Father   . Cancer Father 41       kidney stage IV but not aggressive  . Henoch-Schonlein purpura Daughter     Current Outpatient Prescriptions on File Prior to Visit  Medication Sig Dispense Refill  . citalopram (CELEXA) 10 MG tablet TAKE 1 TABLET (10 MG TOTAL) BY MOUTH DAILY. 90 tablet 3  . levocetirizine (XYZAL) 2.5 MG/5ML solution Take 5 mLs (2.5 mg total) by mouth every evening. 148 mL 12  . methocarbamol (ROBAXIN) 500 MG tablet TAKE 1 TABLET 3 TIMES A DAY AS NEEDED FOR MUSCLE SPASM 90 tablet 2  . montelukast (SINGULAIR) 10 MG tablet Take 10 mg by mouth at bedtime.    . Multiple Vitamin (MULTIVITAMIN) tablet Take 1 tablet by mouth daily.       No current facility-administered medications on file prior to visit.     Allergies  Allergen Reactions  . Latex     History  Alcohol Use  . 0.0 oz/week    Comment: occ    History  Smoking Status  . Never Smoker  Smokeless Tobacco  . Never Used    Review of Systems  Constitutional: Negative.   HENT: Positive for ear pain and sinus pain.   Eyes: Negative.   Respiratory: Negative.   Cardiovascular: Negative.   Gastrointestinal: Positive for heartburn.  Genitourinary: Negative.   Musculoskeletal: Positive for joint pain.  Neurological: Negative.   Endo/Heme/Allergies: Negative.    Psychiatric/Behavioral: Negative.     Objective   Vitals:   05/21/17 0927  BP: (!) 141/84  Pulse: 68  Resp: 18  Temp: 98.2 F (36.8 C)    Physical Exam  Constitutional: He is oriented to person, place, and time and well-developed, well-nourished, and in no distress.  HENT:  Head: Normocephalic and atraumatic.  Neck: Normal range of motion. Neck supple.  Cardiovascular: Normal rate, regular rhythm and normal heart sounds.   No murmur heard. Pulmonary/Chest: Effort normal. No respiratory distress. He has no wheezes. He has no rales.  Abdominal: Soft. Bowel sounds are normal. He exhibits no distension. There is tenderness. There is no rebound.  Reducible right inguinal hernia noted.  Genitourinary:  Genitourinary Comments: Genitourinary examination within normal limits.  Neurological: He is alert and oriented to person, place, and time.  Skin: Skin is warm and dry.  Vitals reviewed.   Assessment  Right inguinal hernia Plan   Patient is scheduled for right renal herniorrhaphy with mesh on 05/24/2017. The risks and benefits of the procedure including bleeding, infection, mesh use, and the possibility of recurrence of the hernia were fully explained to the patient, who gave informed consent.

## 2017-05-22 ENCOUNTER — Encounter (HOSPITAL_COMMUNITY): Payer: Self-pay

## 2017-05-22 ENCOUNTER — Encounter (HOSPITAL_COMMUNITY)
Admission: RE | Admit: 2017-05-22 | Discharge: 2017-05-22 | Disposition: A | Discharge status: Home / Self Care | Payer: BLUE CROSS/BLUE SHIELD | Source: Ambulatory Visit | Attending: General Surgery | Admitting: General Surgery

## 2017-05-22 DIAGNOSIS — F329 Major depressive disorder, single episode, unspecified: Secondary | ICD-10-CM | POA: Diagnosis not present

## 2017-05-22 DIAGNOSIS — M1990 Unspecified osteoarthritis, unspecified site: Secondary | ICD-10-CM | POA: Diagnosis not present

## 2017-05-22 DIAGNOSIS — Z79899 Other long term (current) drug therapy: Secondary | ICD-10-CM | POA: Diagnosis not present

## 2017-05-22 DIAGNOSIS — K4090 Unilateral inguinal hernia, without obstruction or gangrene, not specified as recurrent: Secondary | ICD-10-CM | POA: Diagnosis not present

## 2017-05-22 HISTORY — DX: Personal history of urinary calculi: Z87.442

## 2017-05-22 HISTORY — DX: Unspecified osteoarthritis, unspecified site: M19.90

## 2017-05-22 LAB — BASIC METABOLIC PANEL
ANION GAP: 9 (ref 5–15)
BUN: 14 mg/dL (ref 6–20)
CALCIUM: 9 mg/dL (ref 8.9–10.3)
CO2: 27 mmol/L (ref 22–32)
Chloride: 105 mmol/L (ref 101–111)
Creatinine, Ser: 1.27 mg/dL — ABNORMAL HIGH (ref 0.61–1.24)
GFR calc Af Amer: >60 mL/min (ref >60)
GLUCOSE: 85 mg/dL (ref 65–99)
POTASSIUM: 3.7 mmol/L (ref 3.5–5.1)
SODIUM: 141 mmol/L (ref 135–145)

## 2017-05-22 LAB — CBC WITH DIFFERENTIAL/PLATELET
BASOS ABS: 0.1 10*3/uL (ref 0.0–0.1)
BASOS PCT: 1 %
EOS PCT: 3 %
Eosinophils Absolute: 0.3 10*3/uL (ref 0.0–0.7)
HEMATOCRIT: 43.2 % (ref 39.0–52.0)
HEMOGLOBIN: 14.4 g/dL (ref 13.0–17.0)
LYMPHS PCT: 31 %
Lymphs Abs: 2.9 10*3/uL (ref 0.7–4.0)
MCH: 30.4 pg (ref 26.0–34.0)
MCHC: 33.3 g/dL (ref 30.0–36.0)
MCV: 91.1 fL (ref 78.0–100.0)
MONO ABS: 1.2 10*3/uL — AB (ref 0.1–1.0)
Monocytes Relative: 13 %
Neutro Abs: 4.8 10*3/uL (ref 1.7–7.7)
Neutrophils Relative %: 52 %
Platelets: 319 10*3/uL (ref 150–400)
RBC: 4.74 MIL/uL (ref 4.22–5.81)
RDW: 12.9 % (ref 11.5–15.5)
WBC: 9.2 10*3/uL (ref 4.0–10.5)

## 2017-05-24 ENCOUNTER — Ambulatory Visit (HOSPITAL_COMMUNITY)
Admission: RE | Admit: 2017-05-24 | Discharge: 2017-05-24 | Disposition: A | Discharge status: Home / Self Care | Payer: BLUE CROSS/BLUE SHIELD | Source: Ambulatory Visit | Attending: General Surgery | Admitting: General Surgery

## 2017-05-24 ENCOUNTER — Encounter (HOSPITAL_COMMUNITY)
Admission: RE | Disposition: A | Discharge status: Home / Self Care | Payer: Self-pay | Source: Ambulatory Visit | Attending: General Surgery

## 2017-05-24 ENCOUNTER — Ambulatory Visit (HOSPITAL_COMMUNITY): Payer: BLUE CROSS/BLUE SHIELD | Admitting: Anesthesiology

## 2017-05-24 ENCOUNTER — Encounter (HOSPITAL_COMMUNITY): Payer: Self-pay | Admitting: Anesthesiology

## 2017-05-24 DIAGNOSIS — F329 Major depressive disorder, single episode, unspecified: Secondary | ICD-10-CM | POA: Insufficient documentation

## 2017-05-24 DIAGNOSIS — Z79899 Other long term (current) drug therapy: Secondary | ICD-10-CM | POA: Insufficient documentation

## 2017-05-24 DIAGNOSIS — K4090 Unilateral inguinal hernia, without obstruction or gangrene, not specified as recurrent: Secondary | ICD-10-CM | POA: Diagnosis not present

## 2017-05-24 DIAGNOSIS — M1990 Unspecified osteoarthritis, unspecified site: Secondary | ICD-10-CM | POA: Insufficient documentation

## 2017-05-24 HISTORY — PX: INGUINAL HERNIA REPAIR: SHX194

## 2017-05-24 SURGERY — REPAIR, HERNIA, INGUINAL, ADULT
Anesthesia: General | Site: Inguinal | Laterality: Right

## 2017-05-24 MED ORDER — KETOROLAC TROMETHAMINE 30 MG/ML IJ SOLN
30.0000 mg | Freq: Once | INTRAMUSCULAR | Status: AC
  Administered 2017-05-24: 30 mg via INTRAVENOUS

## 2017-05-24 MED ORDER — ACETAMINOPHEN 325 MG PO TABS
650.0000 mg | ORAL_TABLET | Freq: Four times a day (QID) | ORAL | Status: DC | PRN
  Administered 2017-05-24: 650 mg via ORAL

## 2017-05-24 MED ORDER — LORAZEPAM 2 MG/ML IJ SOLN: 1.0000 mg | Freq: Once | INTRAMUSCULAR | Status: DC

## 2017-05-24 MED ORDER — FENTANYL CITRATE (PF) 100 MCG/2ML IJ SOLN
25.0000 ug | INTRAMUSCULAR | Status: DC | PRN
  Filled 2017-05-24: qty 2

## 2017-05-24 MED ORDER — FENTANYL CITRATE (PF) 100 MCG/2ML IJ SOLN
INTRAMUSCULAR | Status: AC
  Filled 2017-05-24: qty 2

## 2017-05-24 MED ORDER — GLYCOPYRROLATE 0.2 MG/ML IJ SOLN
INTRAMUSCULAR | Status: AC
  Filled 2017-05-24: qty 1

## 2017-05-24 MED ORDER — BUPIVACAINE LIPOSOME 1.3 % IJ SUSP
INTRAMUSCULAR | Status: AC
  Filled 2017-05-24: qty 20

## 2017-05-24 MED ORDER — ONDANSETRON 4 MG PO TBDP
4.0000 mg | ORAL_TABLET | Freq: Once | ORAL | Status: AC
  Administered 2017-05-24: 4 mg via ORAL

## 2017-05-24 MED ORDER — MIDAZOLAM HCL 2 MG/2ML IJ SOLN
INTRAMUSCULAR | Status: AC
  Filled 2017-05-24: qty 2

## 2017-05-24 MED ORDER — GLYCOPYRROLATE 0.2 MG/ML IJ SOLN
INTRAMUSCULAR | Status: DC | PRN
  Administered 2017-05-24: 0.2 mg via INTRAVENOUS

## 2017-05-24 MED ORDER — PROPOFOL 10 MG/ML IV BOLUS
INTRAVENOUS | Status: AC
  Filled 2017-05-24: qty 20

## 2017-05-24 MED ORDER — FENTANYL CITRATE (PF) 100 MCG/2ML IJ SOLN
INTRAMUSCULAR | Status: DC | PRN
  Administered 2017-05-24 (×2): 50 ug via INTRAVENOUS
  Administered 2017-05-24 (×2): 25 ug via INTRAVENOUS
  Administered 2017-05-24: 50 ug via INTRAVENOUS

## 2017-05-24 MED ORDER — OXYCODONE-ACETAMINOPHEN 7.5-325 MG PO TABS: 1.0000 | ORAL_TABLET | Freq: Four times a day (QID) | ORAL | 0 refills | Status: DC | PRN

## 2017-05-24 MED ORDER — LIDOCAINE HCL (PF) 1 % IJ SOLN
INTRAMUSCULAR | Status: AC
  Filled 2017-05-24: qty 5

## 2017-05-24 MED ORDER — FENTANYL CITRATE (PF) 100 MCG/2ML IJ SOLN
25.0000 ug | INTRAMUSCULAR | Status: DC | PRN
  Administered 2017-05-24 (×2): 50 ug via INTRAVENOUS

## 2017-05-24 MED ORDER — HYDROMORPHONE HCL 1 MG/ML IJ SOLN
0.2500 mg | INTRAMUSCULAR | Status: DC | PRN
  Administered 2017-05-24 (×3): 0.5 mg via INTRAVENOUS
  Filled 2017-05-24: qty 1

## 2017-05-24 MED ORDER — CHLORHEXIDINE GLUCONATE CLOTH 2 % EX PADS: 6.0000 | MEDICATED_PAD | Freq: Once | CUTANEOUS | Status: DC

## 2017-05-24 MED ORDER — OXYCODONE HCL 5 MG/5ML PO SOLN: 5.0000 mg | Freq: Once | ORAL | Status: AC | PRN

## 2017-05-24 MED ORDER — LIDOCAINE HCL (CARDIAC) 10 MG/ML IV SOLN
INTRAVENOUS | Status: DC | PRN
  Administered 2017-05-24: 50 mg via INTRAVENOUS

## 2017-05-24 MED ORDER — ONDANSETRON 4 MG PO TBDP
ORAL_TABLET | ORAL | Status: AC
  Filled 2017-05-24: qty 1

## 2017-05-24 MED ORDER — CEFAZOLIN SODIUM-DEXTROSE 2-4 GM/100ML-% IV SOLN
2.0000 g | INTRAVENOUS | Status: AC
  Administered 2017-05-24: 2 g via INTRAVENOUS

## 2017-05-24 MED ORDER — ATROPINE SULFATE 0.4 MG/ML IJ SOLN
INTRAMUSCULAR | Status: AC
  Filled 2017-05-24: qty 1

## 2017-05-24 MED ORDER — LACTATED RINGERS IV SOLN
INTRAVENOUS | Status: DC
  Administered 2017-05-24: 08:00:00 via INTRAVENOUS

## 2017-05-24 MED ORDER — 0.9 % SODIUM CHLORIDE (POUR BTL) OPTIME
TOPICAL | Status: DC | PRN
  Administered 2017-05-24: 1000 mL

## 2017-05-24 MED ORDER — HYDROMORPHONE HCL 1 MG/ML IJ SOLN
INTRAMUSCULAR | Status: AC
  Filled 2017-05-24: qty 1

## 2017-05-24 MED ORDER — OXYCODONE HCL 5 MG PO TABS
5.0000 mg | ORAL_TABLET | Freq: Once | ORAL | Status: AC | PRN
  Administered 2017-05-24: 5 mg via ORAL

## 2017-05-24 MED ORDER — OXYCODONE HCL 5 MG PO TABS
ORAL_TABLET | ORAL | Status: AC
  Filled 2017-05-24: qty 1

## 2017-05-24 MED ORDER — CEFAZOLIN SODIUM-DEXTROSE 2-4 GM/100ML-% IV SOLN
INTRAVENOUS | Status: AC
  Filled 2017-05-24: qty 100

## 2017-05-24 MED ORDER — BUPIVACAINE LIPOSOME 1.3 % IJ SUSP
INTRAMUSCULAR | Status: DC | PRN
  Administered 2017-05-24: 20 mL

## 2017-05-24 MED ORDER — KETOROLAC TROMETHAMINE 30 MG/ML IJ SOLN
INTRAMUSCULAR | Status: AC
  Filled 2017-05-24: qty 1

## 2017-05-24 MED ORDER — ACETAMINOPHEN 325 MG PO TABS
ORAL_TABLET | ORAL | Status: AC
  Filled 2017-05-24: qty 2

## 2017-05-24 MED ORDER — MIDAZOLAM HCL 2 MG/2ML IJ SOLN
1.0000 mg | Freq: Once | INTRAMUSCULAR | Status: AC | PRN
  Administered 2017-05-24: 2 mg via INTRAVENOUS

## 2017-05-24 MED ORDER — PROPOFOL 10 MG/ML IV BOLUS
INTRAVENOUS | Status: DC | PRN
  Administered 2017-05-24: 20 mg via INTRAVENOUS
  Administered 2017-05-24: 150 mg via INTRAVENOUS
  Administered 2017-05-24: 30 mg via INTRAVENOUS

## 2017-05-24 SURGICAL SUPPLY — 38 items
ADH SKN CLS APL DERMABOND .7 (GAUZE/BANDAGES/DRESSINGS) ×1
BAG HAMPER (MISCELLANEOUS) ×3 IMPLANT
CLOTH BEACON ORANGE TIMEOUT ST (SAFETY) ×3 IMPLANT
COVER LIGHT HANDLE STERIS (MISCELLANEOUS) ×6 IMPLANT
DERMABOND ADVANCED (GAUZE/BANDAGES/DRESSINGS) ×2
DERMABOND ADVANCED .7 DNX12 (GAUZE/BANDAGES/DRESSINGS) ×1 IMPLANT
DRAIN PENROSE 18X1/2 LTX STRL (DRAIN) ×3 IMPLANT
ELECT REM PT RETURN 9FT ADLT (ELECTROSURGICAL) ×3
ELECTRODE REM PT RTRN 9FT ADLT (ELECTROSURGICAL) ×1 IMPLANT
GAUZE SPONGE 4X4 12PLY STRL (GAUZE/BANDAGES/DRESSINGS) ×2 IMPLANT
GLOVE BIOGEL PI IND STRL 6.5 (GLOVE) IMPLANT
GLOVE BIOGEL PI IND STRL 7.0 (GLOVE) ×2 IMPLANT
GLOVE BIOGEL PI INDICATOR 6.5 (GLOVE) ×2
GLOVE BIOGEL PI INDICATOR 7.0 (GLOVE) ×4
GLOVE SURG SS PI 6.5 STRL IVOR (GLOVE) ×2 IMPLANT
GLOVE SURG SS PI 7.0 STRL IVOR (GLOVE) ×2 IMPLANT
GLOVE SURG SS PI 7.5 STRL IVOR (GLOVE) ×3 IMPLANT
GOWN STRL REUS W/ TWL XL LVL3 (GOWN DISPOSABLE) ×1 IMPLANT
GOWN STRL REUS W/TWL LRG LVL3 (GOWN DISPOSABLE) ×6 IMPLANT
GOWN STRL REUS W/TWL XL LVL3 (GOWN DISPOSABLE) ×3
INST SET MINOR GENERAL (KITS) ×3 IMPLANT
KIT ROOM TURNOVER APOR (KITS) ×3 IMPLANT
MANIFOLD NEPTUNE II (INSTRUMENTS) ×3 IMPLANT
MESH MARLEX PLUG MEDIUM (Mesh General) ×2 IMPLANT
NDL HYPO 21X1.5 SAFETY (NEEDLE) ×1 IMPLANT
NEEDLE HYPO 21X1.5 SAFETY (NEEDLE) ×3 IMPLANT
NS IRRIG 1000ML POUR BTL (IV SOLUTION) ×3 IMPLANT
PACK MINOR (CUSTOM PROCEDURE TRAY) ×3 IMPLANT
PAD ARMBOARD 7.5X6 YLW CONV (MISCELLANEOUS) ×3 IMPLANT
SET BASIN LINEN APH (SET/KITS/TRAYS/PACK) ×3 IMPLANT
SUT NOVA NAB GS-22 2 2-0 T-19 (SUTURE) ×4 IMPLANT
SUT VIC AB 2-0 CT1 27 (SUTURE) ×3
SUT VIC AB 2-0 CT1 TAPERPNT 27 (SUTURE) ×1 IMPLANT
SUT VIC AB 3-0 SH 27 (SUTURE) ×3
SUT VIC AB 3-0 SH 27X BRD (SUTURE) ×1 IMPLANT
SUT VIC AB 4-0 PS2 27 (SUTURE) ×3 IMPLANT
SUT VICRYL AB 3 0 TIES (SUTURE) ×2 IMPLANT
SYR 20CC LL (SYRINGE) ×3 IMPLANT

## 2017-05-24 NOTE — OR Nursing (Signed)
Patient became faint , pale  And hot at time of IV stick, pressure dropped  And pulse dropped

## 2017-05-24 NOTE — Anesthesia Postprocedure Evaluation (Signed)
Anesthesia Post Note  Patient: Jonathan Costa  Procedure(s) Performed: Procedure(s) (LRB): HERNIA REPAIR INGUINAL ADULT (Right)  Patient location during evaluation: Short Stay Anesthesia Type: General Level of consciousness: awake and alert Pain management: satisfactory to patient Vital Signs Assessment: post-procedure vital signs reviewed and stable Respiratory status: spontaneous breathing Cardiovascular status: stable Postop Assessment: no signs of nausea or vomiting and adequate PO intake     Last Vitals:  Vitals:   05/24/17 1040 05/24/17 1043  BP:  (!) 136/91  Pulse: 74   Resp: 16   Temp: 36.9 C   SpO2: 95%     Last Pain:  Vitals:   05/24/17 1040  TempSrc: Oral  PainSc: 4                  Bettyjean Stefanski

## 2017-05-24 NOTE — Transfer of Care (Signed)
Immediate Anesthesia Transfer of Care Note  Patient: Jonathan Costa  Procedure(s) Performed: Procedure(s): HERNIA REPAIR INGUINAL ADULT (Right)  Patient Location: PACU  Anesthesia Type:General  Level of Consciousness: awake and patient cooperative  Airway & Oxygen Therapy: Patient Spontanous Breathing and non-rebreather face mask  Post-op Assessment: Report given to RN and Post -op Vital signs reviewed and stable  Post vital signs: Reviewed and stable  Last Vitals:  Vitals:   05/24/17 0821 05/24/17 0822  BP:    Pulse:    Resp: 15 14  Temp:    SpO2: 94% 93%    Last Pain:  Vitals:   05/24/17 0731  TempSrc: Oral      Patients Stated Pain Goal: 5 (73/22/02 5427)  Complications: No apparent anesthesia complications

## 2017-05-24 NOTE — Interval H&P Note (Signed)
History and Physical Interval Note:  05/24/2017 8:04 AM  Jonathan Costa  has presented today for surgery, with the diagnosis of right inguinal hernia  The various methods of treatment have been discussed with the patient and family. After consideration of risks, benefits and other options for treatment, the patient has consented to  Procedure(s): HERNIA REPAIR INGUINAL ADULT (Right) as a surgical intervention .  The patient's history has been reviewed, patient examined, no change in status, stable for surgery.  I have reviewed the patient's chart and labs.  Questions were answered to the patient's satisfaction.     Aviva Signs

## 2017-05-24 NOTE — Op Note (Signed)
Patient:  RANCE SMITHSON  DOB:  [birth year]  MRN:  [id number]   Preop Diagnosis:  Right inguinal hernia  Postop Diagnosis:  Same, indirect  Procedure:  Right inguinal herniorrhaphy with mesh  Surgeon:  Aviva Signs, M.D.  Asst.: Curlene Labrum, M.D.  Anes:  Gen.  Indications:  Patient is a 46 year old white male who presents with a symptomatic right inguinal hernia. The risks and benefits of the procedure including bleeding, infection, mesh use, and the possibility of recurrence of the hernia were fully explained to the patient, who gave informed consent.  Procedure note:  The patient was placed in the supine position. After general anesthesia was administered, the right groin region was prepped and draped using the usual sterile technique with Techni-Care. Surgical site confirmation was performed.  An incision was made in the right groin region down to the external oblique aponeuroses. Aponeuroses was incised to the external ring. A vascular loop was placed around spermatic cord. The vase deference was no within the spermatic cord. A lipoma the spermatic cord was found and a high ligation was performed using a 3-0 Vicryl tie. The ilioinguinal nerve was identified. It was attenuated, thus a high ligation was performed using a 3-0 Vicryl tie. An indirect hernia sac was found. This was freed away from the spermatic cord up to the peritoneal reflection and inverted. A medium sized Bard PerFix plug was then inserted. An onlay patch was then placed along the floor of the inguinal canal and secured superiorly to the conjoined tendon and inferiorly to the shelving edge of Poupart's ligament using 2-0 Novafil interrupted sutures. The internal ring was re-created using a 2-0 Novafil interrupted suture. The external oblique aponeuroses was reapproximated using a 2-0 Vicryl running suture. Subcutaneous layer was reapproximated using a 3-0 Vicryl interrupted suture. The skin was closed using a 4-0 Vicryl  subcuticular suture.  Exparel was instilled into the surrounding wound. Dermabond was applied.  All tape and needle counts were correct at the end of the procedure. Patient was awakened and transferred to PACU in stable condition.  Complications:  None  EBL:  Minimal  Specimen:  None

## 2017-05-24 NOTE — Anesthesia Preprocedure Evaluation (Addendum)
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  Airway Mallampati: I  TM Distance: >3 FB Neck ROM: Full  Mouth opening: Limited Mouth Opening Comment: Mouth opening only slightly limited  (roughly 85% opening) Dental  (+) Teeth Intact   Pulmonary neg pulmonary ROS,  singulair for allergies/ deviated septum   Pulmonary exam normal breath sounds clear to auscultation       Cardiovascular Exercise Tolerance: Good METS: 7 - 9 Mets negative cardio ROS Normal cardiovascular exam Rhythm:Regular Rate:Normal  Normal sinus rhythm Incomplete right bundle branch block Septal infarct , age undetermined   Neuro/Psych Depression stablenegative neurological ROS  negative psych ROS   GI/Hepatic negative GI ROS, Neg liver ROS,   Endo/Other  negative endocrine ROS  Renal/GU negative Renal ROS  negative genitourinary   Musculoskeletal negative musculoskeletal ROS (+) Arthritis , Osteoarthritis,    Abdominal Normal abdominal exam  (+)   Peds  Hematology negative hematology ROS (+)   Anesthesia Other Findings   Reproductive/Obstetrics                            Anesthesia Physical Anesthesia Plan  ASA: II  Anesthesia Plan: General   Post-op Pain Management:    Induction:   PONV Risk Score and Plan: Ondansetron and Midazolam  Airway Management Planned: LMA  Additional Equipment:   Intra-op Plan:   Post-operative Plan: Extubation in OR  Informed Consent: I have reviewed the patients History and Physical, chart, labs and discussed the procedure including the risks, benefits and alternatives for the proposed anesthesia with the patient or authorized representative who has indicated his/her understanding and acceptance.   Dental advisory given  Plan Discussed with:  CRNA  Anesthesia Plan Comments:         Anesthesia Quick Evaluation

## 2017-05-24 NOTE — Discharge Instructions (Signed)
Open Hernia Repair, Adult, Care After This sheet gives you information about how to care for yourself after your procedure. Your health care provider may also give you more specific instructions. If you have problems or questions, contact your health care provider. What can I expect after the procedure? After the procedure, it is common to have:  Mild discomfort.  Slight bruising.  Minor swelling.  Pain in the abdomen.  Follow these instructions at home: Incision care   Follow instructions from your health care provider about how to take care of your incision area. Make sure you: ? Wash your hands with soap and water before you change your bandage (dressing). If soap and water are not available, use hand sanitizer. ? Change your dressing as told by your health care provider. ? Leave stitches (sutures), skin glue, or adhesive strips in place. These skin closures may need to stay in place for 2 weeks or longer. If adhesive strip edges start to loosen and curl up, you may trim the loose edges. Do not remove adhesive strips completely unless your health care provider tells you to do that.  Check your incision area every day for signs of infection. Check for: ? More redness, swelling, or pain. ? More fluid or blood. ? Warmth. ? Pus or a bad smell. Activity  Do not drive or use heavy machinery while taking prescription pain medicine. Do not drive until your health care provider approves.  Until your health care provider approves: ? Do not lift anything that is heavier than 10 lb (4.5 kg). ? Do not play contact sports.  Return to your normal activities as told by your health care provider. Ask your health care provider what activities are safe. General instructions  To prevent or treat constipation while you are taking prescription pain medicine, your health care provider may recommend that you: ? Drink enough fluid to keep your urine clear or pale yellow. ? Take over-the-counter or  prescription medicines. ? Eat foods that are high in fiber, such as fresh fruits and vegetables, whole grains, and beans. ? Limit foods that are high in fat and processed sugars, such as fried and sweet foods.  Take over-the-counter and prescription medicines only as told by your health care provider.  Do not take tub baths or go swimming until your health care provider approves.  Keep all follow-up visits as told by your health care provider. This is important. Contact a health care provider if:  You develop a rash.  You have more redness, swelling, or pain around your incision.  You have more fluid or blood coming from your incision.  Your incision feels warm to the touch.  You have pus or a bad smell coming from your incision.  You have a fever or chills.  You have blood in your stool (feces).  You have not had a bowel movement in 2-3 days.  Your pain is not controlled with medicine. Get help right away if:  You have chest pain or shortness of breath.  You feel light-headed or feel faint.  You have severe pain.  You vomit and your pain is worse. This information is not intended to replace advice given to you by your health care provider. Make sure you discuss any questions you have with your health care provider. Document Released: 03/30/2005 Document Revised: 03/30/2016 Document Reviewed: 02/22/2016 Elsevier Interactive Patient Education  2017 [employer].

## 2017-05-24 NOTE — Anesthesia Procedure Notes (Signed)
Procedure Name: LMA Insertion Date/Time: 05/24/2017 8:33 AM Performed by: Vista Deck Pre-anesthesia Checklist: Patient identified, Patient being monitored, Emergency Drugs available, Timeout performed and Suction available Patient Re-evaluated:Patient Re-evaluated prior to induction Oxygen Delivery Method: Circle System Utilized Preoxygenation: Pre-oxygenation with 100% oxygen Induction Type: IV induction Ventilation: Mask ventilation without difficulty LMA: LMA inserted LMA Size: 4.0 Number of attempts: 1 Placement Confirmation: positive ETCO2 and breath sounds checked- equal and bilateral Tube secured with: Tape Dental Injury: Teeth and Oropharynx as per pre-operative assessment

## 2017-05-30 ENCOUNTER — Ambulatory Visit (INDEPENDENT_AMBULATORY_CARE_PROVIDER_SITE_OTHER): Payer: Self-pay | Admitting: General Surgery

## 2017-05-30 ENCOUNTER — Encounter: Payer: Self-pay | Admitting: General Surgery

## 2017-05-30 VITALS — BP 141/82 | HR 69 | Temp 97.7°F | Resp 18 | Ht 74.0 in | Wt 215.0 lb

## 2017-05-30 DIAGNOSIS — Z09 Encounter for follow-up examination after completed treatment for conditions other than malignant neoplasm: Secondary | ICD-10-CM

## 2017-05-30 NOTE — Progress Notes (Signed)
Subjective:     Jonathan Costa  Status post right inguinal herniorrhaphy with mesh. Doing well. Swelling resolving. Pain resolving. Objective:    BP (!) 141/82   Pulse 69   Temp 97.7 F (36.5 C)   Resp 18   Ht 6\' 2"  (1.88 m)   Wt 215 lb (97.5 kg)   BMI 27.60 kg/m   General:  alert, cooperative and no distress  Abdomen soft, incision healing well. Ecchymosis resolving. No seroma or hematoma.     Assessment:    Doing well postoperatively.    Plan:   Gradually resume normal activity. Follow-up as needed.

## 2017-06-17 DIAGNOSIS — R040 Epistaxis: Secondary | ICD-10-CM | POA: Diagnosis not present

## 2017-06-17 DIAGNOSIS — J3089 Other allergic rhinitis: Secondary | ICD-10-CM | POA: Diagnosis not present

## 2017-06-17 DIAGNOSIS — J3081 Allergic rhinitis due to animal (cat) (dog) hair and dander: Secondary | ICD-10-CM | POA: Diagnosis not present

## 2017-10-22 NOTE — Progress Notes (Signed)
I called the patient and LVM to schedule.

## 2017-10-24 NOTE — Progress Notes (Signed)
I called the patient again and LVM to schedule.

## 2018-01-08 ENCOUNTER — Telehealth: Payer: Self-pay | Admitting: Family Medicine

## 2018-01-08 NOTE — Telephone Encounter (Signed)
Yes thanks may refill 

## 2018-01-08 NOTE — Telephone Encounter (Signed)
See note

## 2018-01-08 NOTE — Telephone Encounter (Signed)
Pt requesting refill of Celexa 10mg . Can pt be provided with enough medication until physical on 03/10/18?  LOV: 10/16/16 Next OV 03/10/18  PCP: Dr. Yong Channel  [facility]

## 2018-01-08 NOTE — Telephone Encounter (Signed)
Copied from [place] [id number]. Topic: Quick Communication - Rx Refill/Question >> Jan 08, 2018 10:44 AM Ether Griffins B wrote: Medication: citalopram (CELEXA) 10 MG tablet  Has the patient contacted their pharmacy? Yes.   (Agent: If no, request that the patient contact the pharmacy for the refill.)  Preferred Pharmacy (with phone number or street name): [id number] - [place], [place]: Please be advised that RX refills may take up to 3 business days. We ask that you follow-up with your pharmacy.

## 2018-01-09 ENCOUNTER — Other Ambulatory Visit: Payer: Self-pay

## 2018-01-09 MED ORDER — CITALOPRAM HYDROBROMIDE 10 MG PO TABS: ORAL_TABLET | ORAL | 0 refills | Status: DC

## 2018-01-09 NOTE — Telephone Encounter (Signed)
90 day supply provided to get patient to appointment

## 2018-03-10 ENCOUNTER — Encounter: Payer: Self-pay | Admitting: Family Medicine

## 2018-03-10 ENCOUNTER — Ambulatory Visit (INDEPENDENT_AMBULATORY_CARE_PROVIDER_SITE_OTHER): Payer: BLUE CROSS/BLUE SHIELD | Admitting: Family Medicine

## 2018-03-10 VITALS — BP 118/88 | HR 67 | Temp 99.0°F | Ht 74.0 in | Wt 221.8 lb

## 2018-03-10 DIAGNOSIS — F3342 Major depressive disorder, recurrent, in full remission: Secondary | ICD-10-CM

## 2018-03-10 DIAGNOSIS — Z0000 Encounter for general adult medical examination without abnormal findings: Secondary | ICD-10-CM

## 2018-03-10 DIAGNOSIS — E611 Iron deficiency: Secondary | ICD-10-CM

## 2018-03-10 DIAGNOSIS — M26609 Unspecified temporomandibular joint disorder, unspecified side: Secondary | ICD-10-CM

## 2018-03-10 DIAGNOSIS — Z79899 Other long term (current) drug therapy: Secondary | ICD-10-CM

## 2018-03-10 LAB — CBC WITH DIFFERENTIAL/PLATELET
BASOS ABS: 0.1 10*3/uL (ref 0.0–0.1)
Basophils Relative: 1 % (ref 0.0–3.0)
EOS PCT: 1.4 % (ref 0.0–5.0)
Eosinophils Absolute: 0.1 10*3/uL (ref 0.0–0.7)
HEMATOCRIT: 43.4 % (ref 39.0–52.0)
Hemoglobin: 14.7 g/dL (ref 13.0–17.0)
LYMPHS PCT: 28.8 % (ref 12.0–46.0)
Lymphs Abs: 2.4 10*3/uL (ref 0.7–4.0)
MCHC: 33.8 g/dL (ref 30.0–36.0)
MCV: 89.6 fl (ref 78.0–100.0)
MONOS PCT: 11.6 % (ref 3.0–12.0)
Monocytes Absolute: 1 10*3/uL (ref 0.1–1.0)
NEUTROS ABS: 4.7 10*3/uL (ref 1.4–7.7)
Neutrophils Relative %: 57.2 % (ref 43.0–77.0)
PLATELETS: 298 10*3/uL (ref 150.0–400.0)
RBC: 4.85 Mil/uL (ref 4.22–5.81)
RDW: 14.2 % (ref 11.5–15.5)
WBC: 8.2 10*3/uL (ref 4.0–10.5)

## 2018-03-10 LAB — COMPREHENSIVE METABOLIC PANEL
ALK PHOS: 84 U/L (ref 39–117)
ALT: 49 U/L (ref 0–53)
AST: 28 U/L (ref 0–37)
Albumin: 4.4 g/dL (ref 3.5–5.2)
BILIRUBIN TOTAL: 0.5 mg/dL (ref 0.2–1.2)
BUN: 11 mg/dL (ref 6–23)
CALCIUM: 9.3 mg/dL (ref 8.4–10.5)
CO2: 29 mEq/L (ref 19–32)
Chloride: 103 mEq/L (ref 96–112)
Creatinine, Ser: 1.28 mg/dL (ref 0.40–1.50)
GFR: 63.75 mL/min (ref >60.00)
Glucose, Bld: 99 mg/dL (ref 70–99)
Potassium: 3.7 mEq/L (ref 3.5–5.1)
Sodium: 140 mEq/L (ref 135–145)
TOTAL PROTEIN: 7.9 g/dL (ref 6.0–8.3)

## 2018-03-10 MED ORDER — METHOCARBAMOL 500 MG PO TABS: ORAL_TABLET | ORAL | 2 refills | Status: DC

## 2018-03-10 NOTE — Patient Instructions (Signed)
Please stop by lab before you go  Glad you are doing so well  No changes today other than upping the exercise to 150 minutes a week (or at least working toward that) and trying to trim off 5-10 lbs over next year

## 2018-03-10 NOTE — Assessment & Plan Note (Signed)
S: phq9 of 1 on celexa 10mg  A/P: doing well - continue current medicine. Recurrence off of med in past.

## 2018-03-10 NOTE — Progress Notes (Signed)
Your CBC was normal (blood counts, infection fighting cells, platelets). Your CMET was normal (kidney, liver, and electrolytes, blood sugar).

## 2018-03-10 NOTE — Progress Notes (Signed)
Phone: [phone]  Subjective:  Patient presents today for their annual physical. Chief complaint-noted.   See problem oriented charting- ROS- full  review of systems was completed and negative except for: some TMJD pain, occasional hip pain  The following were reviewed and entered/updated in epic: Past Medical History:  Diagnosis Date  . Allergy   . Arthritis   . DEPRESSION 07/03/2008  . History of kidney stones   . SHINGLES 06/08/2010   Patient Active Problem List   Diagnosis Date Noted  . Depression 07/03/2008    Priority: Medium  . 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 Bleeding from the nose 05/14/2015    Priority: Low  . Allergic rhinitis 05/13/2015    Priority: Low  . Vertigo 05/13/2015    Priority: Low  . Herpes zoster 06/08/2010    Priority: Low  . Non-recurrent unilateral inguinal hernia without obstruction or gangrene   . Low iron 10/16/2016  . Hernia, inguinal, right 10/16/2016   Past Surgical History:  Procedure Laterality Date  . HIP SURGERY Right 2012   traumatic- "tree fell on me"  . INGUINAL HERNIA REPAIR Right 05/24/2017   Procedure: HERNIA REPAIR INGUINAL ADULT;  Surgeon: Aviva Signs, MD;  Location: [facility];  Service: General;  Laterality: Right;    Family History  Problem Relation Age of Onset  . Cancer Mother 77       breast  . Hypertension Father   . Cancer Father 63       kidney stage IV but not aggressive  . Henoch-Schonlein purpura Daughter     Medications- reviewed and updated Current Outpatient Medications  Medication Sig Dispense Refill  . acetaminophen (TYLENOL) 500 MG tablet Take 500 mg by mouth every 6 (six) hours as needed.    . cetirizine (ZYRTEC) 10 MG tablet Take 10 mg by mouth daily.    . citalopram (CELEXA) 10 MG tablet TAKE 1 TABLET (10 MG TOTAL) BY MOUTH DAILY. 90 tablet 0  . montelukast (SINGULAIR) 10 MG tablet Take 10 mg by mouth at bedtime.    . naproxen sodium (ANAPROX) 220 MG  tablet Take 220 mg by mouth 2 (two) times daily with a meal.    . ranitidine (ZANTAC) 75 MG tablet Take 75 mg by mouth daily as needed for heartburn.    . methocarbamol (ROBAXIN) 500 MG tablet TAKE 1 TABLET 3 TIMES A DAY AS NEEDED FOR MUSCLE SPASM 90 tablet 2   No current facility-administered medications for this visit.     Allergies-reviewed and updated Allergies  Allergen Reactions  . Latex   . Dye Fdc Red [Red Dye] Swelling and Rash    Pt has "pink dye" allergy.    Social History   Social History Narrative   Married 21 years in 2016. 2 daughters- 1 driving in [id number].       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 A lot of travel.    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 Oropharynx normal Neck:  no thyromegaly CV: RRR no murmurs rubs or gallops Lungs: CTAB no crackles, wheeze, rhonchi Abdomen: soft/nontender/nondistended/normal bowel sounds. No rebound or guarding.  Ext: no edema Skin: warm, dry Neuro: grossly normal, moves all extremities, PERRLA  Assessment/Plan:  47 y.o. male presenting for annual physical.  Health Maintenance counseling: 1. Anticipatory guidance: Patient counseled regarding regular dental exams -q6 months, eye exams - yearly, wearing seatbelts.  2. Risk factor reduction:  Advised patient of need for regular exercise and diet rich and fruits and vegetables to reduce risk of heart attack and stroke. Exercise- doing some swimming and some walking- discussed goal 150 minutes a week. Diet- [place] and  has done some overeating lately- we discussed working on trimming weight back off.  Wt Readings from Last 3 Encounters:  03/10/18 221 lb 12.8 oz (100.6 kg)  05/30/17 215 lb (97.5 kg)  05/24/17 214 lb (97.1 kg)  3. Immunizations/screenings/ancillary studies- up to date Immunization History  Administered Date(s) Administered  . Td 09/25/1995  . Tdap 09/24/2010  4. Prostate cancer screening- no family history, start at age 96 . Granddad on fathers side was in 35s with elevated PSA 5. Colon cancer screening - no family history, start at age 59. May consider cologuard 6. Skin cancer screening/prevention- Dr. Eula Listen office yearly. advised regular sunscreen use. Denies worrisome, changing, or new skin lesions.  7. STD screening- patient opts out as monogamous 8. Never smoker  Status of chronic or acute concerns   Allergic rhinitis- has seen allergist in past and  on singulair and generic zyrtec  Right inguinal hernia- s/p repair 05/2017  TMJD- mouthguard and sparing robaxin- can refill up to a year from this visit  Low iron history- ferritin had been trending down in recent years on labs from work. Does have intermittent hemorrhoid related bleeding. See prior notes on #s. No rectal bleeding.   Depression S: phq9 of 1 on celexa 10mg  A/P: doing well - continue current medicine. Recurrence off of med in past.   Return in about 1 year (around 03/11/2019) for physical.  Lab/Order associations: Preventative health care - Plan: CBC with Differential/Platelet, Comprehensive metabolic panel, Iron, TIBC and Ferritin Panel  Temporomandibular dysfunction syndrome - Plan: methocarbamol (ROBAXIN) 500 MG tablet  Encounter for long-term current use of medication - Plan: CBC with Differential/Platelet, Comprehensive metabolic panel  Recurrent major depressive disorder, in full remission ([place]) - Plan: CBC with Differential/Platelet, Comprehensive metabolic panel  Low iron - Plan: CBC with  Differential/Platelet, Iron, TIBC and Ferritin Panel  Meds ordered this encounter  Medications  . methocarbamol (ROBAXIN) 500 MG tablet    Sig: TAKE 1 TABLET 3 TIMES A DAY AS NEEDED FOR MUSCLE SPASM    Dispense:  90 tablet    Refill:  2    Return precautions advised.  Garret Reddish, MD

## 2018-03-11 ENCOUNTER — Telehealth: Payer: Self-pay

## 2018-03-11 LAB — IRON,TIBC AND FERRITIN PANEL
%SAT: 38 % (calc) (ref 20–48)
FERRITIN: 45 ng/mL (ref 38–380)
IRON: 97 ug/dL (ref 50–180)
TIBC: 255 ug/dL (ref 250–425)

## 2018-03-11 NOTE — Telephone Encounter (Signed)
Pt. Given lab results - verbalizes understanding.

## 2018-03-28 ENCOUNTER — Encounter: Payer: Self-pay | Admitting: Family Medicine

## 2018-03-28 ENCOUNTER — Ambulatory Visit: Payer: Commercial Managed Care - PPO | Admitting: Family Medicine

## 2018-03-28 VITALS — BP 136/82 | HR 75 | Temp 97.5°F | Ht 74.0 in | Wt 223.0 lb

## 2018-03-28 DIAGNOSIS — B309 Viral conjunctivitis, unspecified: Secondary | ICD-10-CM | POA: Diagnosis not present

## 2018-03-28 MED ORDER — ERYTHROMYCIN 5 MG/GM OP OINT: 1.0000 "application " | TOPICAL_OINTMENT | Freq: Three times a day (TID) | OPHTHALMIC | 0 refills | Status: DC

## 2018-03-28 MED ORDER — OLOPATADINE HCL 0.2 % OP SOLN: 1.0000 [drp] | Freq: Two times a day (BID) | OPHTHALMIC | 0 refills | Status: DC

## 2018-03-28 NOTE — Progress Notes (Signed)
Patient: Jonathan Costa MRN: [id number] DOB: 10-15-1970 PCP: Marin Olp, MD     Subjective:  Chief Complaint  Patient presents with  . poss left pink eye    HPI: The patient is a 47 y.o. male who presents today for poss left pink eye. He states he was at a public pool yesterday and symptoms started late afternoon yesterday. Eye was red yesterday and then he woke up this morning with goopiness/drainage and redness. It is very itchy. He has no idea if he was around anyone with pink eye. No vision changes and no pain with opening and closing eye. Doesn't feel like he has sand in his eye. He does not wear contacts.   Review of Systems  Constitutional: Negative for fever.  HENT: Positive for rhinorrhea. Negative for congestion, ear pain, sinus pressure and sinus pain.   Eyes: Positive for discharge, redness and itching. Negative for photophobia, pain and visual disturbance.  Respiratory: Negative for shortness of breath and wheezing.     Allergies Patient is allergic to latex and dye fdc red [red dye].  Past Medical History Patient  has a past medical history of Allergy, Arthritis, DEPRESSION (07/03/2008), History of kidney stones, and SHINGLES (06/08/2010).  Surgical History Patient  has a past surgical history that includes Hip surgery (Right, 2012) and Inguinal hernia repair (Right, 05/24/2017).  Family History Pateint's family history includes Cancer (age of onset: 85) in his mother; Cancer (age of onset: 44) in his father; Henoch-Schonlein purpura in his daughter; Hypertension in his father.  Social History Patient  reports that he has never smoked. He has never used smokeless tobacco. He reports that he drinks alcohol. He reports that he does not use drugs.    Objective: Vitals:   03/28/18 1308  BP: 136/82  Pulse: 75  Temp: (!) 97.5 F (36.4 C)  TempSrc: Oral  SpO2: 96%  Weight: 223 lb (101.2 kg)  Height: 6\' 2"  (1.88 m)    Body mass index is 28.63  kg/m.  Physical Exam  Constitutional: He appears well-developed and well-nourished.  HENT:  Right Ear: External ear normal.  Left Ear: External ear normal.  Mouth/Throat: Oropharynx is clear and moist.  Eyes: Pupils are equal, round, and reactive to light. EOM are normal. Right eye exhibits no discharge. Left eye exhibits no discharge.  Left eye with some dried discharge in corner that is clear in color. Mild erythema in sclera in left sclera. Not a lot of injection. Movement intact of both eyes. Vision screen normal.   Cardiovascular: Normal rate, regular rhythm and normal heart sounds.  Pulmonary/Chest: Effort normal and breath sounds normal.  Lymphadenopathy:    He has no cervical adenopathy.  Vitals reviewed.  Vision: 20/20    Assessment/plan: 1. Acute viral conjunctivitis of left eye thera tears q 2 hours as needed and pataday to help itching BID. If not better in 1-2 days or feels like worsening sympotms can start the antibiotic ointment, but looks viral/allergy related at this point. Precautions given for pain, vision changes, etc..     Return if symptoms worsen or fail to improve.   Orma Flaming, MD [place]   03/28/2018

## 2018-04-01 ENCOUNTER — Other Ambulatory Visit: Payer: Self-pay | Admitting: Family Medicine

## 2018-04-03 DIAGNOSIS — H10413 Chronic giant papillary conjunctivitis, bilateral: Secondary | ICD-10-CM | POA: Diagnosis not present

## 2018-04-17 DIAGNOSIS — L814 Other melanin hyperpigmentation: Secondary | ICD-10-CM | POA: Diagnosis not present

## 2018-04-17 DIAGNOSIS — D1801 Hemangioma of skin and subcutaneous tissue: Secondary | ICD-10-CM | POA: Diagnosis not present

## 2018-04-17 DIAGNOSIS — L570 Actinic keratosis: Secondary | ICD-10-CM | POA: Diagnosis not present

## 2018-04-17 DIAGNOSIS — D225 Melanocytic nevi of trunk: Secondary | ICD-10-CM | POA: Diagnosis not present

## 2018-04-17 DIAGNOSIS — L820 Inflamed seborrheic keratosis: Secondary | ICD-10-CM | POA: Diagnosis not present

## 2018-04-22 ENCOUNTER — Other Ambulatory Visit: Payer: Self-pay | Admitting: Family Medicine

## 2018-05-30 ENCOUNTER — Ambulatory Visit: Payer: Commercial Managed Care - PPO | Admitting: Family Medicine

## 2018-05-30 ENCOUNTER — Encounter: Payer: Self-pay | Admitting: Family Medicine

## 2018-05-30 VITALS — BP 126/74 | HR 59 | Temp 98.3°F | Ht 74.0 in | Wt 222.0 lb

## 2018-05-30 DIAGNOSIS — B356 Tinea cruris: Secondary | ICD-10-CM | POA: Diagnosis not present

## 2018-05-30 MED ORDER — KETOCONAZOLE 2 % EX CREA: 1.0000 "application " | TOPICAL_CREAM | Freq: Every day | CUTANEOUS | 0 refills | Status: DC

## 2018-05-30 NOTE — Progress Notes (Signed)
Subjective:  Jonathan Costa is a 47 y.o. year old very pleasant male patient who presents for/with See problem oriented charting ROS-  see ROS below  Past Medical History-  Patient Active Problem List   Diagnosis Date Noted  . Depression 07/03/2008    Priority: Medium  . 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 Bleeding from the nose 05/14/2015    Priority: Low  . Allergic rhinitis 05/13/2015    Priority: Low  . Vertigo 05/13/2015    Priority: Low  . Herpes zoster 06/08/2010    Priority: Low  . Non-recurrent unilateral inguinal hernia without obstruction or gangrene   . Low iron 10/16/2016  . Hernia, inguinal, right 10/16/2016    Medications- reviewed and updated Current Outpatient Medications  Medication Sig Dispense Refill  . acetaminophen (TYLENOL) 500 MG tablet Take 500 mg by mouth every 6 (six) hours as needed.    . cetirizine (ZYRTEC) 10 MG tablet Take 10 mg by mouth daily.    . citalopram (CELEXA) 10 MG tablet TAKE 1 TABLET BY MOUTH EVERY DAY 30 tablet 2  . erythromycin ophthalmic ointment Place 1 application into the left eye 3 (three) times daily. For 5-7 days 3.5 g 0  . methocarbamol (ROBAXIN) 500 MG tablet TAKE 1 TABLET 3 TIMES A DAY AS NEEDED FOR MUSCLE SPASM 90 tablet 2  . montelukast (SINGULAIR) 10 MG tablet Take 10 mg by mouth at bedtime.    . naproxen sodium (ANAPROX) 220 MG tablet Take 220 mg by mouth 2 (two) times daily with a meal.    . Olopatadine HCl 0.2 % SOLN APPLY 1 DROP TO EYE 2 (TWO) TIMES DAILY. 2.5 mL 0  . ranitidine (ZANTAC) 75 MG tablet Take 75 mg by mouth daily as needed for heartburn.    Marland Kitchen ketoconazole (NIZORAL) 2 % cream Apply 1 application topically daily. 30 g 0   No current facility-administered medications for this visit.     Objective: BP 126/74   Pulse (!) 59   Temp 98.3 F (36.8 C) (Oral)   Ht 6\' 2"  (1.88 m)   Wt 222 lb (100.7 kg)   SpO2 97%   BMI 28.50 kg/m  Gen: NAD, resting comfortably No lip  or tongue swelling CV: RRR no murmurs rubs or gallops Lungs: CTAB no crackles, wheeze, rhonchi Abdomen: soft/nontender/nondistended/normal bowel sounds.   Ext: no edema Skin: warm, dry, pictured right groin below, erythema with some cracking in the skin- some satellite erythematous papules     Assessment/Plan:  Rash S:10 days of rash in right groin. Tried powder to dry it (though maybe too much moisture) but no help. Tried antifungal for several days but no improvement. Does not itch much. No pain in area. Just cant get this to heal  ROS-not ill appearing, no fever/chills. No new medications. Not immunocompromised. No mucus membrane involvement.  A/P: Tinea cruris from avs "Does look like jock itch but not responding to over the counter antifungal  Lets try more aggressive cream ketoconazole 2%. If getting better but not resolved can use up to 3 weeks to help with resolution. If not doing better by 10 days- let me know and I will send in an oral agent likely diflucan to help heal this up. Let me know if worsening symptoms prior to 10 days. "  Meds ordered this encounter  Medications  . ketoconazole (NIZORAL) 2 % cream    Sig: Apply 1 application topically daily.    Dispense:  30 g    Refill:  0   Return precautions advised.  Garret Reddish, MD

## 2018-05-30 NOTE — Patient Instructions (Addendum)
Does look like jock itch but not responding to over the counter antifungal  Lets try more aggressive cream ketoconazole 2%. If getting better but not resolved can use up to 3 weeks to help with resolution. If not doing better by 10 days- let me know and I will send in an oral agent likely diflucan to help heal this up. Let me know if worsening symptoms prior to 10 days.

## 2018-06-05 ENCOUNTER — Telehealth: Payer: Self-pay | Admitting: Family Medicine

## 2018-06-05 NOTE — Telephone Encounter (Signed)
See note

## 2018-06-05 NOTE — Telephone Encounter (Signed)
Patient is calling to request oral agent- reporting cream has not improved symptoms that much and would like something different.

## 2018-06-05 NOTE — Telephone Encounter (Signed)
Copied from [place] [id number]. Topic: Quick Communication - See Telephone Encounter >> Jun 05, 2018  1:45 PM Hewitt Shorts wrote: Pt called stating that the ketoconazole cream is not working all that much and pt is wanting to try the oral agent (diflucan was noted in the chart pt was not sure what the provider was going to use next)  [facility] number  [phone]

## 2018-06-06 MED ORDER — TERBINAFINE HCL 250 MG PO TABS: 250.0000 mg | ORAL_TABLET | Freq: Every day | ORAL | 0 refills | Status: DC

## 2018-06-06 NOTE — Telephone Encounter (Signed)
Left message on voicemail Rx sent to your pharmacy. Any questions please call office.

## 2018-06-06 NOTE — Telephone Encounter (Signed)
Please see message and advise 

## 2018-06-06 NOTE — Telephone Encounter (Signed)
I sent this in oral terbinafine for pateint- please let him know. If faisl to resolve on this please see [place] back or may refer to dermatology. Let us know if symptoms worsen while on it.

## 2018-06-16 ENCOUNTER — Telehealth: Payer: Self-pay | Admitting: Family Medicine

## 2018-06-16 DIAGNOSIS — R21 Rash and other nonspecific skin eruption: Secondary | ICD-10-CM

## 2018-06-16 NOTE — Telephone Encounter (Signed)
Copied from [place] [id number]. Topic: General - Other >> Jun 16, 2018 10:58 AM Judyann Munson wrote: Reason for CRM: patient is calling to advise he was seen on 05-30-18 for a rash  and was prescribe ketoconazole (NIZORAL) 2 % cream  he stated he called in on 9-12 and was prescribe terbinafine (LAMISIL) 250 MG tablet and stated that has not helped either and it seems to have gotten worse. He is requesting a call back.

## 2018-06-18 NOTE — Telephone Encounter (Signed)
Lets do an urgent referral to dermatology under rash and see if we can get him in within a week.

## 2018-06-18 NOTE — Telephone Encounter (Signed)
Please order this for him

## 2018-06-18 NOTE — Telephone Encounter (Signed)
Please see message and advise. Does pt need appt?

## 2018-06-19 NOTE — Telephone Encounter (Signed)
Left message on voicemail to call office. Referral put in for Dermatology, someone will contact him to schedule.

## 2018-06-19 NOTE — Telephone Encounter (Signed)
Jonathan Costa spoke to pt and he said he will contact his Dermatologist.

## 2018-06-20 DIAGNOSIS — J3081 Allergic rhinitis due to animal (cat) (dog) hair and dander: Secondary | ICD-10-CM | POA: Diagnosis not present

## 2018-06-20 DIAGNOSIS — J3089 Other allergic rhinitis: Secondary | ICD-10-CM | POA: Diagnosis not present

## 2018-06-20 DIAGNOSIS — R040 Epistaxis: Secondary | ICD-10-CM | POA: Diagnosis not present

## 2018-06-20 DIAGNOSIS — L304 Erythema intertrigo: Secondary | ICD-10-CM | POA: Diagnosis not present

## 2018-07-12 ENCOUNTER — Other Ambulatory Visit: Payer: Self-pay | Admitting: Family Medicine

## 2018-11-25 ENCOUNTER — Ambulatory Visit: Payer: Commercial Managed Care - PPO | Admitting: Physician Assistant

## 2018-11-25 ENCOUNTER — Encounter: Payer: Self-pay | Admitting: Physician Assistant

## 2018-11-25 VITALS — BP 130/84 | HR 71 | Temp 98.9°F | Ht 74.0 in | Wt 232.0 lb

## 2018-11-25 DIAGNOSIS — H6983 Other specified disorders of Eustachian tube, bilateral: Secondary | ICD-10-CM | POA: Diagnosis not present

## 2018-11-25 DIAGNOSIS — J0100 Acute maxillary sinusitis, unspecified: Secondary | ICD-10-CM | POA: Diagnosis not present

## 2018-11-25 MED ORDER — AMOXICILLIN-POT CLAVULANATE 875-125 MG PO TABS: 1.0000 | ORAL_TABLET | Freq: Two times a day (BID) | ORAL | 0 refills | Status: DC

## 2018-11-25 NOTE — Progress Notes (Signed)
Jonathan Costa is a 48 y.o. male here for a new problem.  I acted as a Education administrator for [employer], PA-C Anselmo Pickler, LPN  History of Present Illness:   Chief Complaint  Patient presents with  . Sinus Problem    Sinus Problem  This is a new problem. The current episode started 1 to 4 weeks ago. The problem has been gradually worsening since onset. There has been no fever. The pain is mild. Associated symptoms include ear pain, headaches and sinus pressure. Pertinent negatives include no congestion, coughing, hoarse voice, neck pain, shortness of breath, sneezing or sore throat. (Pressure in ears, feels like fluid in them.) Past treatments include nothing. The treatment provided no relief.   Patient did go swimming this past week and doors and symptoms started after that.  Past Medical History:  Diagnosis Date  . Allergy   . Arthritis   . DEPRESSION 07/03/2008  . History of kidney stones   . SHINGLES 06/08/2010     Social History   Socioeconomic History  . Marital status: Married    Spouse name: Not on file  . Number of children: Not on file  . Years of education: Not on file  . Highest education level: Not on file  Occupational History  . Not on file  Social Needs  . Financial resource strain: Not on file  . Food insecurity:    Worry: Not on file    Inability: Not on file  . Transportation needs:    Medical: Not on file    Non-medical: Not on file  Tobacco Use  . Smoking status: Never Smoker  . Smokeless tobacco: Never Used  Substance and Sexual Activity  . Alcohol use: Yes    Alcohol/week: 0.0 standard drinks    Comment: occ  . Drug use: No  . Sexual activity: Yes    Birth control/protection: None  Lifestyle  . Physical activity:    Days per week: Not on file    Minutes per session: Not on file  . Stress: Not on file  Relationships  . Social connections:    Talks on phone: Not on file    Gets together: Not on file    Attends religious service: Not on  file    Active member of club or organization: Not on file    Attends meetings of clubs or organizations: Not on file    Relationship status: Not on file  . Intimate partner violence:    Fear of current or ex partner: Not on file    Emotionally abused: Not on file    Physically abused: Not on file    Forced sexual activity: Not on file  Other Topics Concern  . Not on file  Social History Narrative   Married 21 years in 2016. 2 daughters- 1 driving in [id number].       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 A lot of travel.    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 HIP SURGERY Right 2012   traumatic- "tree fell on me"  . INGUINAL HERNIA REPAIR Right 05/24/2017   Procedure: HERNIA REPAIR INGUINAL ADULT;  Surgeon: Aviva Signs, MD;  Location: [facility];  Service: General;  Laterality: Right;    Family History  Problem Relation Age of Onset  .  Cancer Mother 68       breast  . Hypertension Father   . Cancer Father 52       kidney stage IV but not aggressive  . Henoch-Schonlein purpura Daughter     Allergies  Allergen Reactions  . Latex   . Dye Fdc Red [Red Dye] Swelling and Rash    Pt has "pink dye" allergy.    Current Medications:   Current Outpatient Medications:  .  acetaminophen (TYLENOL) 500 MG tablet, Take 500 mg by mouth every 6 (six) hours as needed., Disp: , Rfl:  .  cetirizine (ZYRTEC) 10 MG tablet, Take 10 mg by mouth daily., Disp: , Rfl:  .  citalopram (CELEXA) 10 MG tablet, TAKE 1 TABLET BY MOUTH EVERY DAY, Disp: 30 tablet, Rfl: 5 .  ketoconazole (NIZORAL) 2 % cream, Apply 1 application topically daily., Disp: 30 g, Rfl: 0 .  methocarbamol (ROBAXIN) 500 MG tablet, TAKE 1 TABLET 3 TIMES A DAY AS NEEDED FOR MUSCLE SPASM, Disp: 90 tablet, Rfl: 2 .  montelukast (SINGULAIR) 10 MG tablet,  Take 10 mg by mouth at bedtime., Disp: , Rfl:  .  Multiple Vitamin (MULTI-VITAMIN DAILY PO), Take 1 tablet by mouth daily., Disp: , Rfl:  .  naproxen sodium (ANAPROX) 220 MG tablet, Take 220 mg by mouth 2 (two) times daily with a meal., Disp: , Rfl:  .  omeprazole (PRILOSEC OTC) 20 MG tablet, Take 20 mg by mouth daily as needed., Disp: , Rfl:  .  amoxicillin-clavulanate (AUGMENTIN) 875-125 MG tablet, Take 1 tablet by mouth 2 (two) times daily., Disp: 20 tablet, Rfl: 0   Review of Systems:   Review of Systems  HENT: Positive for ear pain and sinus pressure. Negative for congestion, hoarse voice, sneezing and sore throat.   Respiratory: Negative for cough, hemoptysis and shortness of breath.   Cardiovascular: Negative for chest pain, palpitations and leg swelling.  Musculoskeletal: Negative for neck pain.  Neurological: Positive for headaches.    Vitals:   Vitals:   11/25/18 0948  BP: 130/84  Pulse: 71  Temp: 98.9 F (37.2 C)  TempSrc: Oral  SpO2: 95%  Weight: 232 lb (105.2 kg)  Height: 6\' 2"  (1.88 m)     Body mass index is 29.79 kg/m.  Physical Exam:   Physical Exam Vitals signs and nursing note reviewed.  Constitutional:      General: He is not in acute distress.    Appearance: He is well-developed. He is not ill-appearing or toxic-appearing.  HENT:     Head: Normocephalic and atraumatic.     Right Ear: Tympanic membrane, ear canal and external ear normal. Tympanic membrane is not erythematous, retracted or bulging.     Left Ear: Tympanic membrane, ear canal and external ear normal. Tympanic membrane is not erythematous, retracted or bulging.     Ears:     Comments: Bilateral ears with trace fluid behind TM    Nose: Nasal tenderness, congestion and rhinorrhea present.     Right Sinus: Maxillary sinus tenderness present. No frontal sinus tenderness.     Left Sinus: Maxillary sinus tenderness present. No frontal sinus tenderness.     Comments: Very mild maxillary  tenderness with palpation    Mouth/Throat:     Lips: Pink.     Mouth: Mucous membranes are moist.     Pharynx: Uvula midline. Posterior oropharyngeal erythema present.     Tonsils: Swelling: 0 on the right. 0 on the left.  Eyes:  General: Lids are normal.     Conjunctiva/sclera: Conjunctivae normal.  Neck:     Trachea: Trachea normal.  Cardiovascular:     Rate and Rhythm: Normal rate and regular rhythm.     Heart sounds: Normal heart sounds, S1 normal and S2 normal.  Pulmonary:     Effort: Pulmonary effort is normal.     Breath sounds: Normal breath sounds. No decreased breath sounds, wheezing, rhonchi or rales.  Lymphadenopathy:     Cervical: No cervical adenopathy.  Skin:    General: Skin is warm and dry.  Neurological:     Mental Status: He is alert.  Psychiatric:        Speech: Speech normal.        Behavior: Behavior normal. Behavior is cooperative.     Assessment and Plan:   Fergus was seen today for sinus problem.  Diagnoses and all orders for this visit:  Dysfunction of both eustachian tubes; Acute non-recurrent maxillary sinusitis No red flags on exam.  I discussed with patient that this is likely viral.  We discussed trialing a saline nasal spray, and an oral decongestant such as Sudafed.  He is around day 7 on his symptoms, and does not need antibiotic at this time. I did give him a pocket prescription for Augmentin should he need it.  Discussed taking medications as prescribed. Reviewed return precautions including worsening fever, SOB, worsening cough or other concerns. Push fluids and rest. I recommend that patient follow-up if symptoms worsen or persist despite treatment x 7-10 days, sooner if needed.  Other orders -     amoxicillin-clavulanate (AUGMENTIN) 875-125 MG tablet; Take 1 tablet by mouth 2 (two) times daily.    . Reviewed expectations re: course of current medical issues. . Discussed self-management of symptoms. . Outlined signs and symptoms  indicating need for more acute intervention. . Patient verbalized understanding and all questions were answered. . See orders for this visit as documented in the electronic medical record. . Patient received an After-Visit Summary.  CMA or LPN served as scribe during this visit. History, Physical, and Plan performed by medical provider. The above documentation has been reviewed and is accurate and complete.   Inda Coke, PA-C

## 2018-11-25 NOTE — Patient Instructions (Signed)
It was great to see you!  You have a viral upper respiratory infection. Antibiotics are not needed for this.  Viral infections usually take 7-10 days to resolve.  The cough can last a few weeks to go away.  1. Try plain nasal saline spray in your nose to help keep sinuses moist (if causes bleeding or irritation, stop) 2. Try sudafed, such as the one below. May use generic. Use for 3 days or so to see if helps symptoms. 3. Antibiotic rx given if needed, please wait until day 10 or so, unless symptoms worsen.   Push fluids and get plenty of rest. Please return if you are not improving as expected, or if you have high fevers (>101.5) or difficulty swallowing or worsening productive cough.  Call clinic with questions.  I hope you start feeling better soon!   Eustachian Tube Dysfunction  Eustachian tube dysfunction refers to a condition in which a blockage develops in the narrow passage that connects the middle ear to the back of the nose (eustachian tube). The eustachian tube regulates air pressure in the middle ear by letting air move between the ear and nose. It also helps to drain fluid from the middle ear space. Eustachian tube dysfunction can affect one or both ears. When the eustachian tube does not function properly, air pressure, fluid, or both can build up in the middle ear. What are the causes? This condition occurs when the eustachian tube becomes blocked or cannot open normally. Common causes of this condition include:  Ear infections.  Colds and other infections that affect the nose, mouth, and throat (upper respiratory tract).  Allergies.  Irritation from cigarette smoke.  Irritation from stomach acid coming up into the esophagus (gastroesophageal reflux). The esophagus is the tube that carries food from the mouth to the stomach.  Sudden changes in air pressure, such as from descending in an airplane or scuba diving.  Abnormal growths in the nose or throat, such  as: ? Growths that line the nose (nasal polyps). ? Abnormal growth of cells (tumors). ? Enlarged tissue at the back of the throat (adenoids). What increases the risk? You are more likely to develop this condition if:  You smoke.  You are overweight.  You are a child who has: ? Certain birth defects of the mouth, such as cleft palate. ? Large tonsils or adenoids. What are the signs or symptoms? Common symptoms of this condition include:  A feeling of fullness in the ear.  Ear pain.  Clicking or popping noises in the ear.  Ringing in the ear.  Hearing loss.  Loss of balance.  Dizziness. Symptoms may get worse when the air pressure around you changes, such as when you travel to an area of high elevation, fly on an airplane, or go scuba diving. How is this diagnosed? This condition may be diagnosed based on:  Your symptoms.  A physical exam of your ears, nose, and throat.  Tests, such as those that measure: ? The movement of your eardrum (tympanogram). ? Your hearing (audiometry). How is this treated? Treatment depends on the cause and severity of your condition.  In mild cases, you may relieve your symptoms by moving air into your ears. This is called "popping the ears."  In more severe cases, or if you have symptoms of fluid in your ears, treatment may include: ? Medicines to relieve congestion (decongestants). ? Medicines that treat allergies (antihistamines). ? Nasal sprays or ear drops that contain medicines that reduce swelling (  steroids). ? A procedure to drain the fluid in your eardrum (myringotomy). In this procedure, a small tube is placed in the eardrum to:  Drain the fluid.  Restore the air in the middle ear space. ? A procedure to insert a balloon device through the nose to inflate the opening of the eustachian tube (balloon dilation). Follow these instructions at home: Lifestyle  Do not do any of the following until your health care provider  approves: ? Travel to high altitudes. ? Fly in airplanes. ? Work in a Pension scheme manager or room. ? Scuba dive.  Do not use any products that contain nicotine or tobacco, such as cigarettes and e-cigarettes. If you need help quitting, ask your health care provider.  Keep your ears dry. Wear fitted earplugs during showering and bathing. Dry your ears completely after. General instructions  Take over-the-counter and prescription medicines only as told by your health care provider.  Use techniques to help pop your ears as recommended by your health care provider. These may include: ? Chewing gum. ? Yawning. ? Frequent, forceful swallowing. ? Closing your mouth, holding your nose closed, and gently blowing as if you are trying to blow air out of your nose.  Keep all follow-up visits as told by your health care provider. This is important. Contact a health care provider if:  Your symptoms do not go away after treatment.  Your symptoms come back after treatment.  You are unable to pop your ears.  You have: ? A fever. ? Pain in your ear. ? Pain in your head or neck. ? Fluid draining from your ear.  Your hearing suddenly changes.  You become very dizzy.  You lose your balance. Summary  Eustachian tube dysfunction refers to a condition in which a blockage develops in the eustachian tube.  It can be caused by ear infections, allergies, inhaled irritants, or abnormal growths in the nose or throat.  Symptoms include ear pain, hearing loss, or ringing in the ears.  Mild cases are treated with maneuvers to unblock the ears, such as yawning or ear popping.  Severe cases are treated with medicines. Surgery may also be done (rare). This information is not intended to replace advice given to you by your health care provider. Make sure you discuss any questions you have with your health care provider. Document Released: 10/07/2015 Document Revised: 12/31/2017 Document Reviewed:  12/31/2017 Elsevier Interactive Patient Education  2019 [employer].

## 2018-11-28 ENCOUNTER — Telehealth: Payer: Self-pay | Admitting: Family Medicine

## 2018-11-28 NOTE — Telephone Encounter (Signed)
See note

## 2018-11-28 NOTE — Telephone Encounter (Signed)
Copied from [place] [phone] [phone]. Topic: Quick Communication - See Telephone Encounter >> Nov 28, 2018 10:24 AM Sheran Luz wrote: CRM for notification. See Telephone encounter for: 11/28/18.  Patient is requesting a call back from Dr. Yong Channel or CMA, at their convinience, to discuss erectile dysfunction. Patient is scheduled for appointment on Tuesday 3/10 but would like to discuss before coming in. Please advise.

## 2018-12-02 ENCOUNTER — Other Ambulatory Visit: Payer: Self-pay

## 2018-12-02 ENCOUNTER — Encounter: Payer: Self-pay | Admitting: Family Medicine

## 2018-12-02 ENCOUNTER — Other Ambulatory Visit: Payer: Self-pay | Admitting: Family Medicine

## 2018-12-02 ENCOUNTER — Ambulatory Visit: Payer: Commercial Managed Care - PPO | Admitting: Family Medicine

## 2018-12-02 VITALS — BP 118/70 | HR 64 | Temp 98.2°F | Ht 74.0 in | Wt 233.0 lb

## 2018-12-02 DIAGNOSIS — J329 Chronic sinusitis, unspecified: Secondary | ICD-10-CM

## 2018-12-02 DIAGNOSIS — N5201 Erectile dysfunction due to arterial insufficiency: Secondary | ICD-10-CM | POA: Diagnosis not present

## 2018-12-02 DIAGNOSIS — F3342 Major depressive disorder, recurrent, in full remission: Secondary | ICD-10-CM

## 2018-12-02 DIAGNOSIS — B9689 Other specified bacterial agents as the cause of diseases classified elsewhere: Secondary | ICD-10-CM

## 2018-12-02 DIAGNOSIS — N529 Male erectile dysfunction, unspecified: Secondary | ICD-10-CM | POA: Insufficient documentation

## 2018-12-02 DIAGNOSIS — J3489 Other specified disorders of nose and nasal sinuses: Secondary | ICD-10-CM

## 2018-12-02 MED ORDER — BUPROPION HCL ER (XL) 150 MG PO TB24: 150.0000 mg | ORAL_TABLET | Freq: Every day | ORAL | 5 refills | Status: DC

## 2018-12-02 MED ORDER — AMOXICILLIN-POT CLAVULANATE 875-125 MG PO TABS: 1.0000 | ORAL_TABLET | Freq: Two times a day (BID) | ORAL | 0 refills | Status: DC

## 2018-12-02 NOTE — Telephone Encounter (Signed)
Pt seen today

## 2018-12-02 NOTE — Patient Instructions (Addendum)
Health Maintenance Due  Topic Date Due  . INFLUENZA VACCINE Pt declined 04/24/2018   Start cutting citalopram in half to 5 mg for the next week then stop.   Also go ahead and start wellbutrin 150mg  extended release  Lets give this a trial over the next month. If it is not effective for erectile issues- lets start Viagra/sildenafil or cialis/tadalafil. You can call me if you need one of these even with the change in depression medicine. Or you can sign up for mychart and I can send in through there.   Go ahead and start augmentin given 10 days of sinus pressure/congestion

## 2018-12-02 NOTE — Telephone Encounter (Signed)
Copied from [place] [id number]. Topic: General - Inquiry >> Dec 02, 2018  3:44 PM Virl Axe D wrote: Reason for CRM: Pt stated he just left OV with Dr. Yong Channel and amoxicillin-clavulanate (AUGMENTIN) 875-125 MG tablet has not been sent to pharmacy yet. Please advise as pt stated Dr. Yong Channel would like for him to continue taking this.  [id number] [place] [place] [address] [phone] (Phone) [phone] (Fax)

## 2018-12-02 NOTE — Progress Notes (Signed)
Phone [phone]   Subjective:  Jonathan Costa is a 48 y.o. year old very pleasant male patient who presents for/with See problem oriented charting ROS- complains of ED issues- difficulty getting fully firm erection, states libido normal, no AM erections. No chest pain or shortness of breath at rest or with activity.    Past Medical History-  Patient Active Problem List   Diagnosis Date Noted  . Erectile dysfunction 12/02/2018    Priority: Medium  . Depression 07/03/2008    Priority: Medium  . 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 Bleeding from the nose 05/14/2015    Priority: Low  . Allergic rhinitis 05/13/2015    Priority: Low  . Vertigo 05/13/2015    Priority: Low  . Herpes zoster 06/08/2010    Priority: Low  . Non-recurrent unilateral inguinal hernia without obstruction or gangrene   . Low iron 10/16/2016  . Hernia, inguinal, right 10/16/2016    Medications- reviewed and updated Current Outpatient Medications  Medication Sig Dispense Refill  . acetaminophen (TYLENOL) 500 MG tablet Take 500 mg by mouth every 6 (six) hours as needed.    . cetirizine (ZYRTEC) 10 MG tablet Take 10 mg by mouth daily.    Marland Kitchen ketoconazole (NIZORAL) 2 % cream Apply 1 application topically daily. 30 g 0  . methocarbamol (ROBAXIN) 500 MG tablet TAKE 1 TABLET 3 TIMES A DAY AS NEEDED FOR MUSCLE SPASM 90 tablet 2  . montelukast (SINGULAIR) 10 MG tablet Take 10 mg by mouth at bedtime.    . Multiple Vitamin (MULTI-VITAMIN DAILY PO) Take 1 tablet by mouth daily.    . naproxen sodium (ANAPROX) 220 MG tablet Take 220 mg by mouth 2 (two) times daily with a meal.    . omeprazole (PRILOSEC OTC) 20 MG tablet Take 20 mg by mouth daily as needed.    Marland Kitchen amoxicillin-clavulanate (AUGMENTIN) 875-125 MG tablet Take 1 tablet by mouth 2 (two) times daily. 20 tablet 0  . buPROPion (WELLBUTRIN XL) 150 MG 24 hr tablet Take 1 tablet (150 mg total) by mouth daily. 30 tablet 5   No current  facility-administered medications for this visit.      Objective:  BP 118/70 (BP Location: Left Arm, Patient Position: Sitting, Cuff Size: Large)   Pulse 64   Temp 98.2 F (36.8 C) (Oral)   Ht 6\' 2"  (1.88 m)   Wt 233 lb (105.7 kg)   SpO2 98%   BMI 29.92 kg/m  Gen: NAD, resting comfortably Nasal turbinate erythema with yellow discharge.  Maxillary and frontal sinus tenderness noted.  Pharynx largely normal. CV: RRR no murmurs rubs or gallops Lungs: CTAB no crackles, wheeze, rhonchi Abdomen: soft/nontender/nondistended/normal bowel sounds. Ext: no edema Skin: warm, dry    Assessment and Plan    Bacterial sinusitis S: Patient has had some upper respiratory infection symptoms since 11 days ago. Saw Samantha last week due to ear congestion and sinus pressure- given rx for sinusitis with Augmentin but has not taken that yet. Started sudafed and that helps him coughing up mucus. Still with some sinus pressure and drainage in throat- Symptoms have been persistent. No fever or body aches. No shortness of breath.    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 Was in [place] over weekend. No known exposures to covid 19. Doesn't meet criteria for testing per [facility] policy.  A/P: Given 11 days of symptoms- now meets criteria for bacterial sinusitis.  He did not realize he had Augmentin available to start taking- he agrees to pick this up and start treatment.   Erectile dysfunction S: Patient has been dealing with erectile issues for a while but seems to be getting worse. Feels like he cannot perform- keeps trying to fight through but has issues. Sometimes able to get erection. LIbido is good. No morning erections.   Denies recent abnormal stressors job change 1.5 years ago but things are stable at this point.   Came off muscle relaxer and that didn't help.  A/P: we discussed his issues could be related to celexa- see depression section  with planned to switch to wellbutrin. If this is not effective within the next month- will send in cialis 20mg  tablets (willl plan to start with half tablet) to see if helpful for patient.  - also plan on testing testosterone at CPE given no morning erections (he declined for now)   Depression S: well controlled on celexa 10mg - concern ED issues are contributed to by celexa Depression screen [facility] 2/9 12/02/2018  Decreased Interest 0  Down, Depressed, Hopeless 0  PHQ - 2 Score 0  Altered sleeping 1  Tired, decreased energy 1  Change in appetite 0  Feeling bad or failure about yourself  0  Trouble concentrating 0  Moving slowly or fidgety/restless 0  Suicidal thoughts 0  PHQ-9 Score 2  Difficult doing work/chores Not difficult at all  A/P: well controlled but we are going to transition off celexa (see avs) to wellbutrin 150mg  XR to see if this is helpful for ED issues- with him being overweight- also reasonable as may help with weight management   Other notes: 1.upcoming sleep study.    Future Appointments  Date Time Provider [place]  12/17/2018 10:30 AM Deneise Lever, MD [facility] None   Lab/Order associations: Bacterial sinusitis  Erectile dysfunction due to arterial insufficiency  Recurrent major depressive disorder, in full remission ([place])  Meds ordered this encounter  Medications  . buPROPion (WELLBUTRIN XL) 150 MG 24 hr tablet    Sig: Take 1 tablet (150 mg total) by mouth daily.    Dispense:  30 tablet    Refill:  5    Return precautions advised. Should return if ED issues do not resolve with above plan or if depression worsens.  Garret Reddish, MD

## 2018-12-02 NOTE — Telephone Encounter (Signed)
I called Pt to inform him that medication, Augmentin has been sent in today, per Dr. Yong Channel.

## 2018-12-02 NOTE — Assessment & Plan Note (Signed)
S: Patient has been dealing with erectile issues for a while but seems to be getting worse. Feels like he cannot perform- keeps trying to fight through but has issues. Sometimes able to get erection. LIbido is good. No morning erections.   Denies recent abnormal stressors job change 1.5 years ago but things are stable at this point.   Came off muscle relaxer and that didn't help.  A/P: we discussed his issues could be related to celexa- see depression section with planned to switch to wellbutrin. If this is not effective within the next month- will send in cialis 20mg  tablets (willl plan to start with half tablet) to see if helpful for patient.  - also plan on testing testosterone at CPE given no morning erections (he declined for now)

## 2018-12-02 NOTE — Assessment & Plan Note (Signed)
S: well controlled on celexa 10mg - concern ED issues are contributed to by celexa Depression screen [facility] 2/9 12/02/2018  Decreased Interest 0  Down, Depressed, Hopeless 0  PHQ - 2 Score 0  Altered sleeping 1  Tired, decreased energy 1  Change in appetite 0  Feeling bad or failure about yourself  0  Trouble concentrating 0  Moving slowly or fidgety/restless 0  Suicidal thoughts 0  PHQ-9 Score 2  Difficult doing work/chores Not difficult at all  A/P: we are going to transition off celexa (see avs) to wellbutrin 150mg  XR to see if this is helpful for ED issues- with him being overweight- also reasonable as may help with weight management

## 2018-12-17 ENCOUNTER — Other Ambulatory Visit: Payer: Self-pay

## 2018-12-17 ENCOUNTER — Encounter: Payer: Self-pay | Admitting: Internal Medicine

## 2018-12-17 ENCOUNTER — Ambulatory Visit (INDEPENDENT_AMBULATORY_CARE_PROVIDER_SITE_OTHER): Payer: Commercial Managed Care - PPO | Admitting: Internal Medicine

## 2018-12-17 VITALS — BP 134/78 | HR 66 | Temp 97.3°F | Ht 74.0 in | Wt 231.0 lb

## 2018-12-17 DIAGNOSIS — R0683 Snoring: Secondary | ICD-10-CM

## 2018-12-17 DIAGNOSIS — G4733 Obstructive sleep apnea (adult) (pediatric): Secondary | ICD-10-CM

## 2018-12-17 DIAGNOSIS — J3089 Other allergic rhinitis: Secondary | ICD-10-CM | POA: Diagnosis not present

## 2018-12-17 DIAGNOSIS — J302 Other seasonal allergic rhinitis: Secondary | ICD-10-CM | POA: Diagnosis not present

## 2018-12-17 NOTE — Assessment & Plan Note (Signed)
History consistent with OSA. We will schedule HST when able (covid delay). We discussed treatment options. If only snoring, then ENT can look at his self-reported deviated septum. Since he has already tried an oral appliance, we would consider CPAP. Plan HST
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
## 2018-12-17 NOTE — Assessment & Plan Note (Signed)
He indicates snoring is not confined to peak pollen seasons

## 2018-12-17 NOTE — Patient Instructions (Signed)
Order-  schedule unattended home sleep test   Dx OSA  Please call me for results and recommendations about 2 weeks after your test is done. If appropriate, we may be able to start treatment before we see you next.

## 2018-12-19 ENCOUNTER — Encounter: Payer: Self-pay | Admitting: Family Medicine

## 2018-12-19 NOTE — Telephone Encounter (Signed)
Please see message . Thank you .

## 2018-12-22 MED ORDER — TADALAFIL 20 MG PO TABS: 20.0000 mg | ORAL_TABLET | ORAL | 11 refills | Status: DC | PRN

## 2018-12-24 ENCOUNTER — Other Ambulatory Visit: Payer: Self-pay | Admitting: Family Medicine

## 2019-01-29 ENCOUNTER — Ambulatory Visit: Payer: Commercial Managed Care - PPO

## 2019-01-29 ENCOUNTER — Other Ambulatory Visit: Payer: Self-pay

## 2019-01-29 DIAGNOSIS — G4733 Obstructive sleep apnea (adult) (pediatric): Secondary | ICD-10-CM

## 2019-01-30 DIAGNOSIS — G4733 Obstructive sleep apnea (adult) (pediatric): Secondary | ICD-10-CM

## 2019-02-02 ENCOUNTER — Ambulatory Visit (INDEPENDENT_AMBULATORY_CARE_PROVIDER_SITE_OTHER): Payer: Commercial Managed Care - PPO | Admitting: Internal Medicine

## 2019-02-02 ENCOUNTER — Other Ambulatory Visit: Payer: Self-pay

## 2019-02-02 ENCOUNTER — Encounter: Payer: Self-pay | Admitting: Internal Medicine

## 2019-02-02 VITALS — BP 112/62 | HR 72 | Ht 74.0 in | Wt 232.2 lb

## 2019-02-02 DIAGNOSIS — F458 Other somatoform disorders: Secondary | ICD-10-CM

## 2019-02-02 DIAGNOSIS — G4733 Obstructive sleep apnea (adult) (pediatric): Secondary | ICD-10-CM | POA: Diagnosis not present

## 2019-02-02 NOTE — Assessment & Plan Note (Signed)
Unsuccessful with oral appliance. He should bee a good candidate for CPAP. Appropriate discussion, education and questions answered. Plan - new CPAP auto 5-20

## 2019-02-02 NOTE — Patient Instructions (Signed)
Order- new DME, new CPAP auto 5-20, mask of choice, humidifier, supplies, [place]  Please call the DME company and [place] as needed
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
## 2019-02-02 NOTE — Assessment & Plan Note (Signed)
Discussed wearing bruxism guard with CPAP

## 2019-02-22 ENCOUNTER — Other Ambulatory Visit: Payer: Self-pay | Admitting: Family Medicine

## 2019-03-27 ENCOUNTER — Other Ambulatory Visit: Payer: Self-pay | Admitting: Family Medicine

## 2019-03-30 NOTE — Telephone Encounter (Signed)
Patient need to schedule an ov for more refills. 

## 2019-03-31 NOTE — Telephone Encounter (Signed)
Last OV 12/02/18

## 2019-04-08 ENCOUNTER — Encounter: Payer: Self-pay | Admitting: Pulmonary Disease

## 2019-04-08 ENCOUNTER — Other Ambulatory Visit: Payer: Self-pay

## 2019-04-08 ENCOUNTER — Ambulatory Visit: Payer: Commercial Managed Care - PPO | Admitting: Internal Medicine

## 2019-04-08 ENCOUNTER — Ambulatory Visit (INDEPENDENT_AMBULATORY_CARE_PROVIDER_SITE_OTHER): Payer: Commercial Managed Care - PPO | Admitting: Pulmonary Disease

## 2019-04-08 VITALS — BP 122/70 | HR 70 | Temp 97.7°F | Ht 74.0 in | Wt 225.8 lb

## 2019-04-08 DIAGNOSIS — G4733 Obstructive sleep apnea (adult) (pediatric): Secondary | ICD-10-CM | POA: Diagnosis not present
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
## 2019-04-08 NOTE — Assessment & Plan Note (Signed)
Assessment: Severe obstructive sleep apnea on May/2020 sleep study with an AHI of 34.9, desaturations to 77% BMI today 28.99 Actively swims Previously unsuccessful with oral appliance CPAP compliance report today shows excellent compliance  Plan: Mask of choice to try different CPAP mask through DME company Could consider mask fitting and sleep lab if patient symptoms and fit do not improve Continue CPAP therapy Follow-up in 1 year

## 2019-04-08 NOTE — Patient Instructions (Addendum)
Please send order to DME company for  >>>mask of choice >>>2 month compliance download   If mask of choice does not help with patient's comfort then may need to consider mask fitting and sleep lab  We recommend that you continue using your CPAP daily >>>Keep up the hard work using your device >>> Goal should be wearing this for the entire night that you are sleeping, at least 4 to 6 hours  Remember:  . Do not drive or operate heavy machinery if tired or drowsy.  . Please notify the supply company and office if you are unable to use your device regularly due to missing supplies or machine being broken.  . Work on maintaining a healthy weight and following your recommended nutrition plan  . Maintain proper daily exercise and movement  . Maintaining proper use of your device can also help improve management of other chronic illnesses such as: Blood pressure, blood sugars, and weight management.   BiPAP/ CPAP Cleaning:  >>>Clean weekly, with Dawn soap, and bottle brush.  Set up to air dry.   Return in about 1 year (around 04/07/2020), or if symptoms worsen or fail to improve, for Follow up with Dr. Annamaria Boots.   Coronavirus (COVID-19) Are you at risk?  Are you at risk for the Coronavirus (COVID-19)?  To be considered HIGH RISK for Coronavirus (COVID-19), you have to meet the following criteria:  . Traveled to [place], [place], [place], [place] or [place]; or in the [place] to [place], [place], [place], or [place]; and have fever, cough, and shortness of breath within the last 2 weeks of travel OR . Been in close contact with a person diagnosed with COVID-19 within the last 2 weeks and have fever, cough, and shortness of breath . IF YOU DO NOT MEET THESE CRITERIA, YOU ARE CONSIDERED LOW RISK FOR COVID-19.  What to do if you are HIGH RISK for COVID-19?  Marland Kitchen If you are having a medical emergency, call 911. . Seek medical care right away. Before you go to a doctor's office, urgent  care or emergency department, call ahead and tell them about your recent travel, contact with someone diagnosed with COVID-19, and your symptoms. You should receive instructions from your physician's office regarding next steps of care.  . When you arrive at healthcare provider, tell the healthcare staff immediately you have returned from visiting [place], [place], [place], [place] or [place]; or traveled in the [place] to [place], [place], [place], or [place]; in the last two weeks or you have been in close contact with a person diagnosed with COVID-19 in the last 2 weeks.   . Tell the health care staff about your symptoms: fever, cough and shortness of breath. . After you have been seen by a medical provider, you will be either: o Tested for (COVID-19) and discharged home on quarantine except to seek medical care if symptoms worsen, and asked to  - Stay home and avoid contact with others until you get your results (4-5 days)  - Avoid travel on public transportation if possible (such as bus, train, or airplane) or o Sent to the Emergency Department by EMS for evaluation, COVID-19 testing, and possible admission depending on your condition and test results.  What to do if you are LOW RISK for COVID-19?  Reduce your risk of any infection by using the same precautions used for avoiding the common cold or flu:  Marland Kitchen Wash your hands often with soap and  warm water for at least 20 seconds.  If soap and water are not readily available, use an alcohol-based hand sanitizer with at least 60% alcohol.  . If coughing or sneezing, cover your mouth and nose by coughing or sneezing into the elbow areas of your shirt or coat, into a tissue or into your sleeve (not your hands). . Avoid shaking hands with others and consider head nods or verbal greetings only. . Avoid touching your eyes, nose, or mouth with unwashed hands.  . Avoid close contact with people who are sick. . Avoid places or events with large  numbers of people in one location, like concerts or sporting events. . Carefully consider travel plans you have or are making. . If you are planning any travel outside or inside the [place], visit the CDC's Travelers' Health webpage for the latest health notices. . If you have some symptoms but not all symptoms, continue to monitor at home and seek medical attention if your symptoms worsen. . If you are having a medical emergency, call 911.   [place] / e-Visit: [email or web address]         [facility]: [facility]: [phone]                   [facility]: [phone]           It is flu season:   >>> Best ways to protect herself from the flu: Receive the yearly flu vaccine, practice good hand hygiene washing with soap and also using hand sanitizer when available, eat a nutritious meals, get adequate rest, hydrate appropriately   Please contact the office if your symptoms worsen or you have concerns that you are not improving.   Thank you for choosing [facility] for your healthcare, and for allowing [place] to partner with you on your healthcare journey. I am thankful to be able to provide care to you today.   Wyn Quaker FNP-C

## 2019-05-05 ENCOUNTER — Ambulatory Visit: Payer: Commercial Managed Care - PPO | Admitting: Pulmonary Disease

## 2019-06-22 ENCOUNTER — Other Ambulatory Visit: Payer: Self-pay | Admitting: Family Medicine

## 2019-07-01 ENCOUNTER — Telehealth: Payer: Self-pay | Admitting: Family Medicine

## 2019-07-01 ENCOUNTER — Other Ambulatory Visit: Payer: Self-pay | Admitting: Family Medicine

## 2019-07-01 DIAGNOSIS — M26609 Unspecified temporomandibular joint disorder, unspecified side: Secondary | ICD-10-CM

## 2019-07-01 NOTE — Telephone Encounter (Signed)
Copied from [place] [id number]. Topic: Quick Communication - Rx Refill/Question >> Jul 01, 2019  4:03 PM Leward Quan A wrote: Medication: citalopram (CELEXA) 10 MG tablet   Patient states that due to recent life events he need to be back on this medication  Has the patient contacted their pharmacy? Yes.   (Agent: If no, request that the patient contact the pharmacy for the refill.) (Agent: If yes, when and what did the pharmacy advise?)  Preferred Pharmacy (with phone number or street name): [id number] [place], [place] [id number] (Phone) [phone] (Fax)    Agent: Please be advised that RX refills may take up to 3 business days. We ask that you follow-up with your pharmacy.

## 2019-07-02 NOTE — Telephone Encounter (Signed)
See note

## 2019-07-02 NOTE — Telephone Encounter (Signed)
Tried calling pt to give below recommendations per Dr. Yong Channel. Pt mailbox is full, will try again.

## 2019-07-02 NOTE — Telephone Encounter (Signed)
See below

## 2019-07-02 NOTE — Telephone Encounter (Signed)
I would suggest a visit at noon today-can be video visit or phone visit but I think we need to discuss what is going on and patient needs updated PHQ 9

## 2019-07-03 ENCOUNTER — Ambulatory Visit (INDEPENDENT_AMBULATORY_CARE_PROVIDER_SITE_OTHER): Payer: Commercial Managed Care - PPO | Admitting: Family Medicine

## 2019-07-03 ENCOUNTER — Encounter: Payer: Self-pay | Admitting: Family Medicine

## 2019-07-03 ENCOUNTER — Telehealth: Payer: Self-pay

## 2019-07-03 ENCOUNTER — Telehealth: Payer: Self-pay | Admitting: Family Medicine

## 2019-07-03 VITALS — Ht 74.0 in | Wt 220.0 lb

## 2019-07-03 DIAGNOSIS — N5201 Erectile dysfunction due to arterial insufficiency: Secondary | ICD-10-CM | POA: Diagnosis not present

## 2019-07-03 DIAGNOSIS — F3342 Major depressive disorder, recurrent, in full remission: Secondary | ICD-10-CM

## 2019-07-03 DIAGNOSIS — F5109 Other insomnia not due to a substance or known physiological condition: Secondary | ICD-10-CM | POA: Diagnosis not present

## 2019-07-03 MED ORDER — ZOLPIDEM TARTRATE 5 MG PO TABS: 5.0000 mg | ORAL_TABLET | Freq: Every evening | ORAL | 0 refills | Status: DC | PRN

## 2019-07-03 MED ORDER — TADALAFIL 20 MG PO TABS: 20.0000 mg | ORAL_TABLET | ORAL | 11 refills | Status: DC | PRN

## 2019-07-03 MED ORDER — CITALOPRAM HYDROBROMIDE 10 MG PO TABS: 10.0000 mg | ORAL_TABLET | Freq: Every day | ORAL | 3 refills | Status: DC

## 2019-07-03 NOTE — Telephone Encounter (Signed)
See below

## 2019-07-03 NOTE — Telephone Encounter (Signed)
Spoke with patient and advise him to contact office for appointment per provider notes from yesterday. Patient will be calling to get scheduled

## 2019-07-03 NOTE — Patient Instructions (Signed)
Health Maintenance Due  Topic Date Due  . INFLUENZA VACCINE  04/25/2019   Depression screen [facility] 2/9 12/02/2018 03/10/2018  Decreased Interest 0 0  Down, Depressed, Hopeless 0 0  PHQ - 2 Score 0 0  Altered sleeping 1 0  Tired, decreased energy 1 0  Change in appetite 0 1  Feeling bad or failure about yourself  0 0  Trouble concentrating 0 0  Moving slowly or fidgety/restless 0 0  Suicidal thoughts 0 0  PHQ-9 Score 2 1  Difficult doing work/chores Not difficult at all Not difficult at all

## 2019-07-03 NOTE — Telephone Encounter (Signed)
Pt scheduled at 4:20pm today.

## 2019-07-03 NOTE — Telephone Encounter (Signed)
Patient called to schedule an appt. To get his medication refilled for Citalopram 10mg .  Patient stated that he is all out of the medication.  Please advise and call to discuss at [phone]

## 2019-07-03 NOTE — Progress Notes (Signed)
Phone [phone]   Subjective:  Virtual visit via Video note. Chief complaint: Chief Complaint  Patient presents with  . virtual visit  . discuss medication for sleep  . refill citalopram    This visit type was conducted due to national recommendations for restrictions regarding the COVID-19 Pandemic (e.g. social distancing).  This format is felt to be most appropriate for this patient at this time balancing risks to patient and risks to population by having him in for in person visit.  No physical exam was performed (except for noted visual exam or audio findings with Telehealth visits).    Our team/I connected with Jonathan Costa at  4:20 PM EDT by a video enabled telemedicine application (doxy.me or caregility through epic) and verified that I am speaking with the correct person using two identifiers.  Location patient: [place] Location provider: [facility], office Persons participating in the virtual visit:  patient  Our team/I discussed the limitations of evaluation and management by telemedicine and the availability of in person appointments. In light of current covid-19 pandemic, patient also understands that we are trying to protect them by minimizing in office contact if at all possible.  The patient expressed consent for telemedicine visit and agreed to proceed. Patient understands insurance will be billed.   ROS-denies depressed mood on current medication.  Denies irritability on current medication.  No suicidal ideation.  He is grieving.  Struggling with sleep  Past Medical History-  Patient Active Problem List   Diagnosis Date Noted  . Obstructive sleep apnea 12/17/2018    Priority: Medium  . Erectile dysfunction 12/02/2018    Priority: Medium  . Depression 07/03/2008    Priority: Medium  . 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 Bleeding from the nose 05/14/2015    Priority: Low  . Seasonal and perennial allergic rhinitis 05/13/2015     Priority: Low  . Vertigo 05/13/2015    Priority: Low  . Herpes zoster 06/08/2010    Priority: Low  . 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 Non-recurrent unilateral inguinal hernia without obstruction or gangrene   . Low iron 10/16/2016  . Hernia, inguinal, right 10/16/2016    Medications- reviewed and updated Current Outpatient Medications  Medication Sig Dispense Refill  . acetaminophen (TYLENOL) 500 MG tablet Take 500 mg by mouth every 6 (six) hours as needed.    Marland Kitchen buPROPion (WELLBUTRIN XL) 150 MG 24 hr tablet TAKE 1 TABLET BY MOUTH EVERY DAY 30 tablet 2  . cetirizine (ZYRTEC) 10 MG tablet Take 10 mg by mouth daily.    Marland Kitchen ketoconazole (NIZORAL) 2 % cream Apply 1 application topically daily. 30 g 0  . methocarbamol (ROBAXIN) 500 MG tablet TAKE 1 TABLET 3 TIMES A DAY AS NEEDED FOR MUSCLE SPASM 90 tablet 2  . montelukast (SINGULAIR) 10 MG tablet Take 10 mg by mouth at bedtime.    . Multiple Vitamin (MULTI-VITAMIN DAILY PO) Take 1 tablet by mouth daily.    . Multiple Vitamins-Minerals (VITAMIN D3 COMPLETE PO) Take 1 capsule by mouth daily.    . naproxen sodium (ANAPROX) 220 MG tablet Take 220 mg by mouth 2 (two) times daily with a meal.    . omeprazole (PRILOSEC OTC) 20 MG tablet Take 20 mg by mouth daily as needed.    . tadalafil (CIALIS) 20 MG tablet Take 1 tablet (20 mg total) by mouth every other day as needed for erectile dysfunction. 10 tablet 11  . citalopram (CELEXA) 10 MG tablet Take  1 tablet (10 mg total) by mouth daily. 90 tablet 3  . zolpidem (AMBIEN) 5 MG tablet Take 1 tablet (5 mg total) by mouth at bedtime as needed for sleep. 30 tablet 0   No current facility-administered medications for this visit.      Objective:  Ht 6\' 2"  (1.88 m)   Wt 220 lb (99.8 kg)   BMI 28.25 kg/m  self reported vitals Gen: NAD, resting comfortably Lungs: nonlabored, normal respiratory rate  Skin: appears dry, no obvious rash    Assessment and Plan   # Depression #Situational insomnia #Erectile  dysfunction S: In her last visit in March-we switched patient from citalopram 10 mg to Wellbutrin 150 mg.  Patient found Wellbutrin effective but did note slight increase in irritability/anger.  He felt like overall this was minimal until he lost his father.  Unfortunately patient lost his father to a long battle with kidney cancer on June 21, 2019.  While father was in the process of passing-patient restarted citalopram 10 mg which he had leftover from prior Rx. Noted significant improvement in anxiety/anger being on both medications. Feels more like himself.  Denies any depressed mood while on medicine.  Since dad died- not able to sleep- wife loves how productive he is. He feels like he is wearing thin though only sleeping about 2 hours a night-prior to father's death was able to sleep 8 hours. Was sleeping well before Dad died other than wearing cpap. Has not tried benadryl or melatonin. 2 weeks of poor sleep.   Was able to use half of cialis previously but has been struggling with this dose since restarting citalopram Depression screen [facility] 2/9 07/03/2019  Decreased Interest 0  Down, Depressed, Hopeless 0  PHQ - 2 Score 0  Altered sleeping 3  Tired, decreased energy 0  Change in appetite 0  Feeling bad or failure about yourself  0  Trouble concentrating 0  Moving slowly or fidgety/restless 0  Suicidal thoughts 0  PHQ-9 Score 3  Difficult doing work/chores Not difficult at all  A/P: Depression appears to remain in full remission-we will continue Wellbutrin 150 mg extended release along with citalopram 10 mg-refill provided today on citalopram.  For insomnia-recommended trying a dose of Ambien tonight- gave precautions about sleepwalking and making sure he feels safe to drive the next day.  If he gets a good night's rest- could try melatonin preferably max 3 mg or Benadryl on future nights.  We did discuss sleep hygiene as well  For erectile dysfunction- refilled Cialis and discussed  could use full tablet-discussed relation of SSRI restart and worsening erectile issues  Recommended follow up: 32-month physical or sooner if needed Future Appointments  Date Time Provider [place]  04/07/2020  9:00 AM Deneise Lever, MD [facility] None   Lab/Order associations:   ICD-10-CM   1. Recurrent major depressive disorder, in full remission ([place])  F33.42   2. Situational insomnia  F51.09   3. Erectile dysfunction due to arterial insufficiency  N52.01     Meds ordered this encounter  Medications  . tadalafil (CIALIS) 20 MG tablet    Sig: Take 1 tablet (20 mg total) by mouth every other day as needed for erectile dysfunction.    Dispense:  10 tablet    Refill:  11  . citalopram (CELEXA) 10 MG tablet    Sig: Take 1 tablet (10 mg total) by mouth daily.    Dispense:  90 tablet    Refill:  3  .  zolpidem (AMBIEN) 5 MG tablet    Sig: Take 1 tablet (5 mg total) by mouth at bedtime as needed for sleep.    Dispense:  30 tablet    Refill:  0   Return precautions advised.  Garret Reddish, MD

## 2019-07-03 NOTE — Telephone Encounter (Signed)
Copied from [place] [id number]. Topic: General - Other >> Jul 03, 2019 12:28 PM Pauline Good wrote: Reason for CRM:pt lost his dad last week and need something to sleep. Please advise pt

## 2019-07-03 NOTE — Telephone Encounter (Signed)
See if we can get him in for 4 20 visit today virtual to discuss this

## 2019-07-03 NOTE — Telephone Encounter (Signed)
Citalopram 10mg    [facility]

## 2019-08-31 ENCOUNTER — Other Ambulatory Visit: Payer: Self-pay | Admitting: Family Medicine

## 2019-09-21 ENCOUNTER — Other Ambulatory Visit: Payer: Self-pay | Admitting: Family Medicine

## 2019-12-16 ENCOUNTER — Encounter: Payer: Self-pay | Admitting: Family Medicine

## 2019-12-16 ENCOUNTER — Ambulatory Visit (INDEPENDENT_AMBULATORY_CARE_PROVIDER_SITE_OTHER): Payer: Commercial Managed Care - PPO | Admitting: Family Medicine

## 2019-12-16 ENCOUNTER — Other Ambulatory Visit: Payer: Self-pay

## 2019-12-16 VITALS — BP 168/99 | HR 72 | Temp 97.7°F | Ht 74.0 in | Wt 234.4 lb

## 2019-12-16 DIAGNOSIS — H65113 Acute and subacute allergic otitis media (mucoid) (sanguinous) (serous), bilateral: Secondary | ICD-10-CM | POA: Diagnosis not present

## 2019-12-16 MED ORDER — AMOXICILLIN 875 MG PO TABS: 875.0000 mg | ORAL_TABLET | Freq: Two times a day (BID) | ORAL | 0 refills | Status: AC

## 2019-12-16 MED ORDER — PREDNISONE 10 MG PO TABS: ORAL_TABLET | ORAL | 0 refills | Status: DC

## 2019-12-16 NOTE — Progress Notes (Signed)
Subjective  CC:  Chief Complaint  Patient presents with  . Ear Pain    left ear pain and pressure. started a few days ago. feels like fuid inside   Same day acute visit; PCP not available. New pt to me. Chart reviewed.   HPI: Jonathan Costa is a 49 y.o. male who presents to the office today to address the problems listed above in the chief complaint.  3-5 days of left ear pressure due to fluid. Has had before. Last "infection" about 2 years ago and treated with abx. No f/c/s; + mild allergy sxs starting. On singulair and zyrtec. Can't tolerate nasal steroids due to epistaxis. Has an allergist. No sob or cough or wheeze. No ST or cold sxs.   No h/o HTN. Reviewed records. F/u bp was normal.   Assessment  1. Acute allergic serous otitis media of both ears      Plan   Serous otitis bilateral:  Suspect allergic. Education and counseling given. Trial of oral steroid taper, continuation of allergy meds and decongestant if bp is normal. Give time and f/u if worsens. ABX IF fever or pain start.   Follow up: Return if symptoms worsen or fail to improve.  Visit date not found  No orders of the defined types were placed in this encounter.  Meds ordered this encounter  Medications  . predniSONE (DELTASONE) 10 MG tablet    Sig: Take 4 tabs qd x 2 days, 3 qd x 2 days, 2 qd x 2d, 1qd x 3 days    Dispense:  21 tablet    Refill:  0  . amoxicillin (AMOXIL) 875 MG tablet    Sig: Take 1 tablet (875 mg total) by mouth 2 (two) times daily for 7 days.    Dispense:  14 tablet    Refill:  0      I reviewed the patients updated PMH, FH, and SocHx.    Patient Active Problem List   Diagnosis Date Noted  . 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 Obstructive sleep apnea 12/17/2018  . Erectile dysfunction 12/02/2018  . Non-recurrent unilateral inguinal hernia without obstruction or gangrene   . 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 Low iron 10/16/2016  . Hernia, inguinal, right 10/16/2016  .  Bleeding from the nose 05/14/2015  . Seasonal and perennial allergic rhinitis 05/13/2015  . Vertigo 05/13/2015  . Herpes zoster 06/08/2010  . Depression 07/03/2008   Current Meds  Medication Sig  . acetaminophen (TYLENOL) 500 MG tablet Take 500 mg by mouth every 6 (six) hours as needed.  Marland Kitchen buPROPion (WELLBUTRIN XL) 150 MG 24 hr tablet TAKE 1 TABLET BY MOUTH EVERY DAY  . cetirizine (ZYRTEC) 10 MG tablet Take 10 mg by mouth daily.  . citalopram (CELEXA) 10 MG tablet Take 1 tablet (10 mg total) by mouth daily.  Marland Kitchen ketoconazole (NIZORAL) 2 % cream Apply 1 application topically daily.  . methocarbamol (ROBAXIN) 500 MG tablet TAKE 1 TABLET 3 TIMES A DAY AS NEEDED FOR MUSCLE SPASM  . montelukast (SINGULAIR) 10 MG tablet Take 10 mg by mouth at bedtime.  . Multiple Vitamin (MULTI-VITAMIN DAILY PO) Take 1 tablet by mouth daily.  . Multiple Vitamins-Minerals (VITAMIN D3 COMPLETE PO) Take 1 capsule by mouth daily.  . naproxen sodium (ANAPROX) 220 MG tablet Take 220 mg by mouth 2 (two) times daily with a meal.  . omeprazole (PRILOSEC OTC) 20 MG tablet Take 20 mg by mouth daily as needed.  . tadalafil (CIALIS) 20 MG  tablet TAKE 1 TABLET (20 MG TOTAL) BY MOUTH EVERY OTHER DAY AS NEEDED FOR ERECTILE DYSFUNCTION.  Marland Kitchen zolpidem (AMBIEN) 5 MG tablet Take 1 tablet (5 mg total) by mouth at bedtime as needed for sleep.    Allergies: Patient is allergic to latex and dye fdc red [red dye]. Family History: Patient family history includes Cancer (age of onset: 23) in his mother; Cancer (age of onset: 76) in his father; Henoch-Schonlein purpura in his daughter; Hypertension in his father. Social History:  Patient  reports that he has never smoked. He has never used smokeless tobacco. He reports current alcohol use. He reports that he does not use drugs.  Review of Systems: Constitutional: Negative for fever malaise or anorexia Cardiovascular: negative for chest pain Respiratory: negative for SOB or persistent  cough Gastrointestinal: negative for abdominal pain  Objective  Vitals: BP (!) 168/99 (BP Location: Right Arm, Patient Position: Sitting, Cuff Size: Normal)   Pulse 72   Temp 97.7 F (36.5 C) (Temporal)   Ht 6\' 2"  (1.88 m)   Wt 234 lb 6.4 oz (106.3 kg)   SpO2 93%   BMI 30.10 kg/m  General: no acute distress , A&Ox3 HEENT: PEERL, conjunctiva normal, nasal congestion noted, Bilateral serous fluid levels with nl appearing TMs and landmarks, nl EACs bilaterally, no LAD, neck is supple      Commons side effects, risks, benefits, and alternatives for medications and treatment plan prescribed today were discussed, and the patient expressed understanding of the given instructions. Patient is instructed to call or message via MyChart if he/she has any questions or concerns regarding our treatment plan. No barriers to understanding were identified. We discussed Red Flag symptoms and signs in detail. Patient expressed understanding regarding what to do in case of urgent or emergency type symptoms.   Medication list was reconciled, printed and provided to the patient in AVS. Patient instructions and summary information was reviewed with the patient as documented in the AVS. This note was prepared with assistance of Dragon voice recognition software. Occasional wrong-word or sound-a-like substitutions may have occurred due to the inherent limitations of voice recognition software  This visit occurred during the SARS-CoV-2 public health emergency.  Safety protocols were in place, including screening questions prior to the visit, additional usage of staff PPE, and extensive cleaning of exam room while observing appropriate contact time as indicated for disinfecting solutions.

## 2019-12-16 NOTE — Patient Instructions (Addendum)
Please follow up if symptoms do not improve or as needed.   Continue zyrtec and singulair. Take the prednisone taper as prescribed. You have fluid on both inner ears.  It does not appear to be infected at this time. Treating your allergies more aggressively should help relieve the problem over time. You may continue to try to "blow out" the fluid when you feel pressure. You can add a decongestant as well.  IF you develop fever or increased inner ear pain, you may use the printed RX for the amoxicillin.   Otitis Media, Adult  Otitis media occurs when there is inflammation and fluid in the middle ear. Your middle ear is a part of the ear that contains bones for hearing as well as air that helps send sounds to your brain. What are the causes? This condition is caused by a blockage in the eustachian tube. This tube drains fluid from the ear to the back of the nose (nasopharynx). A blockage in this tube can be caused by an object or by swelling (edema) in the tube. Problems that can cause a blockage include:  A cold or other upper respiratory infection.  Allergies.  An irritant, such as tobacco smoke.  Enlarged adenoids. The adenoids are areas of soft tissue located high in the back of the throat, behind the nose and the roof of the mouth.  A mass in the nasopharynx.  Damage to the ear caused by pressure changes (barotrauma). What are the signs or symptoms? Symptoms of this condition include:  Ear pain.  A fever.  Decreased hearing.  A headache.  Tiredness (lethargy).  Fluid leaking from the ear.  Ringing in the ear. How is this diagnosed? This condition is diagnosed with a physical exam. During the exam your health care provider will use an instrument called an otoscope to look into your ear and check for redness, swelling, and fluid. He or she will also ask about your symptoms. Your health care provider may also order tests, such as:  A test to check the movement of the  eardrum (pneumatic otoscopy). This test is done by squeezing a small amount of air into the ear.  A test that changes air pressure in the middle ear to check how well the eardrum moves and whether the eustachian tube is working (tympanogram). How is this treated? This condition usually goes away on its own within 3-5 days. But if the condition is caused by a bacteria infection and does not go away own its own, or keeps coming back, your health care provider may:  Prescribe antibiotic medicines to treat the infection.  Prescribe or recommend medicines to control pain. Follow these instructions at home:  Take over-the-counter and prescription medicines only as told by your health care provider.  If you were prescribed an antibiotic medicine, take it as told by your health care provider. Do not stop taking the antibiotic even if you start to feel better.  Keep all follow-up visits as told by your health care provider. This is important. Contact a health care provider if:  You have bleeding from your nose.  There is a lump on your neck.  You are not getting better in 5 days.  You feel worse instead of better. Get help right away if:  You have severe pain that is not controlled with medicine.  You have swelling, redness, or pain around your ear.  You have stiffness in your neck.  A part of your face is paralyzed.  The  bone behind your ear (mastoid) is tender when you touch it.  You develop a severe headache. Summary  Otitis media is redness, soreness, and swelling of the middle ear.  This condition usually goes away on its own within 3-5 days.  If the problem does not go away in 3-5 days, your health care provider may prescribe or recommend medicines to treat your symptoms.  If you were prescribed an antibiotic medicine, take it as told by your health care provider. This information is not intended to replace advice given to you by your health care provider. Make sure you  discuss any questions you have with your health care provider. Document Revised: 08/23/2017 Document Reviewed: 08/31/2016 Elsevier Patient Education  2020 [employer].

## 2019-12-26 ENCOUNTER — Other Ambulatory Visit: Payer: Self-pay | Admitting: Family Medicine

## 2020-04-05 ENCOUNTER — Other Ambulatory Visit: Payer: Self-pay | Admitting: Family Medicine

## 2020-04-07 ENCOUNTER — Other Ambulatory Visit: Payer: Self-pay

## 2020-04-07 ENCOUNTER — Ambulatory Visit: Payer: Commercial Managed Care - PPO | Admitting: Internal Medicine

## 2020-04-07 ENCOUNTER — Encounter: Payer: Self-pay | Admitting: Internal Medicine

## 2020-04-07 DIAGNOSIS — J3089 Other allergic rhinitis: Secondary | ICD-10-CM

## 2020-04-07 DIAGNOSIS — J302 Other seasonal allergic rhinitis: Secondary | ICD-10-CM | POA: Diagnosis not present

## 2020-04-07 DIAGNOSIS — G4733 Obstructive sleep apnea (adult) (pediatric): Secondary | ICD-10-CM | POA: Diagnosis not present

## 2020-04-07 NOTE — Patient Instructions (Signed)
We can continue CPAP auto 5-20  Please call if we can help 

## 2020-04-07 NOTE — Assessment & Plan Note (Signed)
Benefits with good compliance and control Plan- continue auto 5-20 

## 2020-04-07 NOTE — Assessment & Plan Note (Signed)
Managed by his allergist when needed. Currently controlled.

## 2020-04-07 NOTE — Progress Notes (Signed)
HPI 58 yoM never smoker, followed for OSA, complicated by allergic rhinitis, TMJ, vertigo, depression, hx trauma(tree fell on him) HST 01/29/2019- AHI 34.9/ hr, desaturation to 77%, body weight 231 lbs --------------------------------------------------------------------------------   02/02/2019- 22 yoM never smoker, followed for OSA, complicated by allergic rhinitis, TMJ, vertigo, depression, hx trauma(tree fell on him) HST 01/29/2019- AHI 34.9/ hr, desaturation to 77%, body weight 231 lbs Previously uncomfortable with oral appliance from his dentist because of tooth pressure. We had detailed discussion of CPAP, masks and usage goals.He agrees.  He will continue to wear a bruxism guard.  CPAP auto 5-20/ [employer] compliance 100%, AHI 0.6/ hr  04/07/20- 41 yoM never smoker, followed for OSA, complicated by allergic rhinitis, TMJ, vertigo, depression, hx trauma(tree fell on him) CPAP auto 5-20/ [employer] Download compliance 100%, AHI 0.6/ hr Body weight today 234 lbs Finally found a full-face mask style that works for him. Comfortable with CPAP and pressures. No Covax- advised him to get vaccinated.  ROS-see HPI   + = positive Constitutional:    weight loss, night sweats, fevers, chills, fatigue, lassitude. HEENT:    headaches, difficulty swallowing, tooth/dental problems, sore throat,       sneezing, itching, ear ache, nasal congestion, post nasal drip, snoring CV:    chest pain, orthopnea, PND, swelling in lower extremities, anasarca,                                  dizziness, palpitations Resp:   shortness of breath with exertion or at rest.                productive cough,   non-productive cough, coughing up of blood.              change in color of mucus.  wheezing.   Skin:    rash or lesions. GI:  No-   heartburn, indigestion, abdominal pain, nausea, vomiting, diarrhea,                 change in bowel habits, loss of appetite GU: dysuria, change in color of urine, no urgency or  frequency.   flank pain. MS:   joint pain, stiffness, decreased range of motion, back pain. Neuro-     nothing unusual Psych:  change in mood or affect.  depression or anxiety.   memory loss.  OBJ- Physical Exam General- Alert, Oriented, Affect-appropriate, Distress- none acute, lean/ fit appearing Skin- rash-none, lesions- none, excoriation- none Lymphadenopathy- none Head- atraumatic            Eyes- Gross vision intact, PERRLA, conjunctivae and secretions clear            Ears- Hearing, canals-normal            Nose- Clear, +Septal dev, mucus, polyps, erosion, perforation             Throat- Mallampati II , mucosa clear , drainage- none, tonsils- atrophic, + own teeth Neck- flexible , trachea midline, no stridor , thyroid nl, carotid no bruit Chest - symmetrical excursion , unlabored           Heart/CV- RRR , no murmur , no gallop  , no rub, nl s1 s2                           - JVD- none , edema- none, stasis changes- none, varices- none  Lung- clear to P&A, wheeze- none, cough- none , dullness-none, rub- none           Chest wall-  Abd-  Br/ Gen/ Rectal- Not done, not indicated Extrem- cyanosis- none, clubbing, none, atrophy- none, strength- nl Neuro- grossly intact to observation

## 2020-05-19 ENCOUNTER — Telehealth (INDEPENDENT_AMBULATORY_CARE_PROVIDER_SITE_OTHER): Payer: Commercial Managed Care - PPO | Admitting: Family Medicine

## 2020-05-19 ENCOUNTER — Encounter: Payer: Self-pay | Admitting: Family Medicine

## 2020-05-19 DIAGNOSIS — U071 COVID-19: Secondary | ICD-10-CM | POA: Diagnosis not present

## 2020-05-19 DIAGNOSIS — R030 Elevated blood-pressure reading, without diagnosis of hypertension: Secondary | ICD-10-CM

## 2020-05-19 DIAGNOSIS — R040 Epistaxis: Secondary | ICD-10-CM

## 2020-05-19 MED ORDER — BENZONATATE 100 MG PO CAPS: 100.0000 mg | ORAL_CAPSULE | Freq: Three times a day (TID) | ORAL | 0 refills | Status: DC | PRN

## 2020-05-19 MED ORDER — ALBUTEROL SULFATE HFA 108 (90 BASE) MCG/ACT IN AERS: 2.0000 | INHALATION_SPRAY | Freq: Four times a day (QID) | RESPIRATORY_TRACT | 0 refills | Status: AC | PRN

## 2020-05-19 NOTE — Patient Instructions (Signed)
-stay home while sick and for a full 10 days since onset of symptoms PLUS one day of no fever and feeling better when you have COVID19  -I sent the medication(s) we discussed to your pharmacy: Meds ordered this encounter  Medications  . benzonatate (TESSALON PERLES) 100 MG capsule    Sig: Take 1 capsule (100 mg total) by mouth 3 (three) times daily as needed.    Dispense:  20 capsule    Refill:  0  . albuterol (PROAIR HFA) 108 (90 Base) MCG/ACT inhaler    Sig: Inhale 2 puffs into the lungs every 6 (six) hours as needed for wheezing or shortness of breath.    Dispense:  1 each    Refill:  0    -call number provided for outpatient COVID19 treatment center [phone]  -can use tylenol if needed for fevers, aches and pains per instructions  - sometimes a short course of Afrin nasal spray for 3 days can help as well  -stay hydrated, drink plenty of fluids and eat small healthy meals - avoid dairy  -can take 1000 IU Vit D3 and Vit C lozenges per instructions  -check out the [facility] website for more information  -follow up with your doctor in 2-3 days unless improving and feeling better  I hope you are feeling better soon! Seek in-person care or a follow up telemedicine visit promptly if your symptoms worsen, new concerns arise or you are not improving as expected. Call 911 if severe symptoms.    Nosebleed, Adult A nosebleed is when blood comes out of the nose. Nosebleeds are common. Usually, they are not a sign of a serious condition. Nosebleeds can happen if a small blood vessel in your nose starts to bleed or if the lining of your nose (mucous membrane) cracks. They are commonly caused by:  Allergies.  Colds.  Picking your nose.  Blowing your nose too hard.  An injury from sticking an object into your nose or getting hit in the nose.  Dry or cold air. Less common causes of nosebleeds include:  Toxic fumes.  Something abnormal in the nose or in the air-filled spaces in  the bones of the face (sinuses).  Growths in the nose, such as polyps.  Medicines or conditions that cause blood to clot slowly.  Certain illnesses or procedures that irritate or dry out the nasal passages. Follow these instructions at home: When you have a nosebleed:   Sit down and tilt your head slightly forward.  Use a clean towel or tissue to pinch your nostrils under the bony part of your nose. After 10 minutes, let go of your nose and see if bleeding starts again. Do not release pressure before that time. If there is still bleeding, repeat the pinching and holding for 10 minutes until the bleeding stops.  Do not place tissues or gauze in the nose to stop bleeding.  Avoid lying down and avoid tilting your head backward. That may make blood collect in the throat and cause gagging or coughing.  Use a nasal spray decongestant to help with a nosebleed as told by your health care provider.  Do not use petroleum jelly or mineral oil in your nose. It can drip into your lungs. After a nosebleed:  Avoid blowing your nose or sniffing for a number of hours.  Avoid straining, lifting, or bending at the waist for several days. You may resume other normal activities as you are able.  Use saline spray or a humidifier  as told by your health care provider.  Aspirinand blood thinners make bleeding more likely. If you are prescribed these medicines and you suffer from nosebleeds: ? Ask your health care provider if you should stop taking the medicines or if you should adjust the dose. ? Do not stop taking medicines that your health care provider has recommended unless told by your health care provider.  If your nosebleed was caused by dry mucous membranes, use over-the-counter saline nasal spray or gel. This will keep the mucous membranes moist and allow them to heal. If you must use a lubricant: ? Choose one that is water-soluble. ? Use only as much as you need and use it only as often as  needed. ? Do not lie down until several hours after you use it. Contact a health care provider if:  You have a fever.  You get nosebleeds often or more often than usual.  You bruise very easily.  You have a nosebleed from having something stuck in your nose.  You have bleeding in your mouth.  You vomit or cough up brown material.  You have a nosebleed after you start a new medicine. Get help right away if:  You have a nosebleed after a fall or a head injury.  Your nosebleed does not go away after 20 minutes.  You feel dizzy or weak.  You have unusual bleeding from other parts of your body.  You have unusual bruising on other parts of your body.  You become sweaty.  You vomit blood. This information is not intended to replace advice given to you by your health care provider. Make sure you discuss any questions you have with your health care provider. Document Revised: 12/10/2017 Document Reviewed: 03/27/2016 Elsevier Patient Education  [place].

## 2020-05-19 NOTE — Progress Notes (Signed)
Virtual Visit via Video Note  I connected with Jonathan Costa  on 05/19/20 at  5:40 PM EDT by a video enabled telemedicine application and verified that I am speaking with the correct person using two identifiers.  Location patient: home Location provider:work or home office Persons participating in the virtual visit: patient, provider  I discussed the limitations of evaluation and management by telemedicine and the availability of in person appointments. The patient expressed understanding and agreed to proceed.   HPI:  Acute visit for Sinus issues/COVID19: -started 05/13/20 -symptoms included: sinus congestion, cough, some mild chest discomfort when cough, had some nose bleeds, HAs -took Augmentin he had leftover starting 4 days ago -then had COVID19 test positive -denies sore throat, fevers, NVD, loss of taste or smell, severe headaches, CP or SOB, inability to get out of bed, inability to tol oral intake -got J and J vaccine for COVID on 05/04/20 -BP 136/86, P 85 - has been taking nsaids   ROS: See pertinent positives and negatives per HPI.  Past Medical History:  Diagnosis Date  . Allergy   . Arthritis   . DEPRESSION 07/03/2008  . History of kidney stones   . SHINGLES 06/08/2010    Past Surgical History:  Procedure Laterality Date  . HIP SURGERY Right 2012   traumatic- "tree fell on me"  . INGUINAL HERNIA REPAIR Right 05/24/2017   Procedure: HERNIA REPAIR INGUINAL ADULT;  Surgeon: Aviva Signs, MD;  Location: [facility];  Service: General;  Laterality: Right;    Family History  Problem Relation Age of Onset  . Cancer Mother 50       breast  . Hypertension Father   . Cancer Father 63       kidney stage IV. steady decline before death about 10 years later.   Marland Kitchen Henoch-Schonlein purpura Daughter     SOCIAL HX: see hpi   Current Outpatient Medications:  .  acetaminophen (TYLENOL) 500 MG tablet, Take 500 mg by mouth every 6 (six) hours as needed., Disp: , Rfl:  .  buPROPion  (WELLBUTRIN XL) 150 MG 24 hr tablet, TAKE 1 TABLET BY MOUTH EVERY DAY, Disp: 30 tablet, Rfl: 2 .  cetirizine (ZYRTEC) 10 MG tablet, Take 10 mg by mouth daily., Disp: , Rfl:  .  citalopram (CELEXA) 10 MG tablet, Take 1 tablet (10 mg total) by mouth daily., Disp: 90 tablet, Rfl: 3 .  ketoconazole (NIZORAL) 2 % cream, Apply 1 application topically daily., Disp: 30 g, Rfl: 0 .  methocarbamol (ROBAXIN) 500 MG tablet, TAKE 1 TABLET 3 TIMES A DAY AS NEEDED FOR MUSCLE SPASM, Disp: 90 tablet, Rfl: 2 .  montelukast (SINGULAIR) 10 MG tablet, Take 10 mg by mouth at bedtime., Disp: , Rfl:  .  Multiple Vitamin (MULTI-VITAMIN DAILY PO), Take 1 tablet by mouth daily., Disp: , Rfl:  .  Multiple Vitamins-Minerals (VITAMIN D3 COMPLETE PO), Take 1 capsule by mouth daily., Disp: , Rfl:  .  naproxen sodium (ANAPROX) 220 MG tablet, Take 220 mg by mouth 2 (two) times daily with a meal., Disp: , Rfl:  .  omeprazole (PRILOSEC OTC) 20 MG tablet, Take 20 mg by mouth daily as needed., Disp: , Rfl:  .  tadalafil (CIALIS) 20 MG tablet, TAKE 1 TABLET (20 MG TOTAL) BY MOUTH EVERY OTHER DAY AS NEEDED FOR ERECTILE DYSFUNCTION., Disp: 15 tablet, Rfl: 2 .  zolpidem (AMBIEN) 5 MG tablet, Take 1 tablet (5 mg total) by mouth at bedtime as needed for sleep., Disp: 30 tablet,  Rfl: 0  EXAM:  VITALS per patient if applicable:  GENERAL: alert, oriented, appears well and in no acute distress  HEENT: atraumatic, conjunttiva clear, no obvious abnormalities on inspection of external nose and ears  NECK: normal movements of the head and neck  LUNGS: on inspection no signs of respiratory distress, breathing rate appears normal, no obvious gross SOB, gasping or wheezing  CV: no obvious cyanosis  MS: moves all visible extremities without noticeable abnormality  PSYCH/NEURO: pleasant and cooperative, no obvious depression or anxiety, speech and thought processing grossly intact  ASSESSMENT AND PLAN:  Discussed the following assessment  and plan:  No diagnosis found.  -we discussed possible serious and likely etiologies, options for evaluation and workup, limitations of telemedicine visit vs in person visit, treatment, treatment risks and precautions. Pt prefers to treat via telemedicine empirically rather then risking or undertaking an in person visit at this moment. Discussed treatment options including MAB and provided number for him to call, potential complications, transmission, isolation, precautions. Sent tessalon and alb inhaler - discussed proper use. Advised to stop abx at this time since covid test was positive.  Discussed how to treat nosebleed if another should occur and to avoid irritating nose. Discussed using tylenol instead of aleve. Discussed emergent precautions for nose bleeds. Discussed his mildly elevated BP and treatment - avoid nsaids and monitor.  Work/School slipped offered:in paitne instructions.  Advised to seek prompt follow up telemedicine visit or in person care if worsening, new symptoms arise, or if is not improving with treatment. Follow up in 2-3 days otherwise with PCP.   I discussed the assessment and treatment plan with the patient. The patient was provided an opportunity to ask questions and all were answered. The patient agreed with the plan and demonstrated an understanding of the instructions.   The patient was advised to call back or seek an in-person evaluation if the symptoms worsen or if the condition fails to improve as anticipated.   Lucretia Kern, DO

## 2020-05-20 ENCOUNTER — Other Ambulatory Visit: Payer: Self-pay | Admitting: Unknown Physician Specialty

## 2020-05-20 ENCOUNTER — Telehealth: Payer: Self-pay | Admitting: Unknown Physician Specialty

## 2020-05-20 ENCOUNTER — Telehealth: Payer: Self-pay | Admitting: Family Medicine

## 2020-05-20 DIAGNOSIS — U071 COVID-19: Secondary | ICD-10-CM

## 2020-05-20 DIAGNOSIS — E663 Overweight: Secondary | ICD-10-CM

## 2020-05-20 NOTE — Telephone Encounter (Signed)
Called patient to set up a 1 week follow up and he states the he was told he can do an infusion but when calling to schedule they told him he has to provide a number, wanted to know if he can get that information.

## 2020-05-20 NOTE — Progress Notes (Signed)
Jonathan Costa is needing a one week follow up, can we use same day for next week?

## 2020-05-20 NOTE — Telephone Encounter (Signed)
Called patient reviewed all information and repeated back to me. Will call if any questions.  ? ?

## 2020-05-20 NOTE — Telephone Encounter (Signed)
I connected by phone with Jonathan Costa on 05/20/2020 at 8:54 AM to discuss the potential use of a new treatment for mild to moderate COVID-19 viral infection in non-hospitalized patients.  This patient is a 49 y.o. male that meets the FDA criteria for Emergency Use Authorization of COVID monoclonal antibody casirivimab/imdevimab.  Has a (+) direct SARS-CoV-2 viral test result  Has mild or moderate COVID-19   Is NOT hospitalized due to COVID-19  Is within 10 days of symptom onset  Has at least one of the high risk factor(s) for progression to severe COVID-19 and/or hospitalization as defined in EUA.  Specific high risk criteria : BMI > 25   I have spoken and communicated the following to the patient or parent/caregiver regarding COVID monoclonal antibody treatment:  1. FDA has authorized the emergency use for the treatment of mild to moderate COVID-19 in adults and pediatric patients with positive results of direct SARS-CoV-2 viral testing who are 34 years of age and older weighing at least 40 kg, and who are at high risk for progressing to severe COVID-19 and/or hospitalization.  2. The significant known and potential risks and benefits of COVID monoclonal antibody, and the extent to which such potential risks and benefits are unknown.  3. Information on available alternative treatments and the risks and benefits of those alternatives, including clinical trials.  4. Patients treated with COVID monoclonal antibody should continue to self-isolate and use infection control measures (e.g., wear mask, isolate, social distance, avoid sharing personal items, clean and disinfect "high touch" surfaces, and frequent handwashing) according to CDC guidelines.   5. The patient or parent/caregiver has the option to accept or refuse COVID monoclonal antibody treatment.  After reviewing this information with the patient, The patient agreed to proceed with receiving casirivimab\imdevimab infusion and  will be provided a copy of the Fact sheet prior to receiving the infusion. Kathrine Haddock 05/20/2020 8:54 AM Sx onset 8/23

## 2020-05-20 NOTE — Telephone Encounter (Signed)
Patient is scheduled for Thursday at 10 virtually.

## 2020-05-20 NOTE — Telephone Encounter (Signed)
Jonathan Costa is needing a one week follow up, can we use same day for next week?

## 2020-05-20 NOTE — Telephone Encounter (Signed)
Yes, that's fine 

## 2020-05-21 ENCOUNTER — Ambulatory Visit (HOSPITAL_COMMUNITY)
Admission: RE | Admit: 2020-05-21 | Discharge: 2020-05-21 | Disposition: A | Discharge status: Home / Self Care | Payer: Commercial Managed Care - PPO | Source: Ambulatory Visit | Attending: Pulmonary Disease | Admitting: Pulmonary Disease

## 2020-05-21 DIAGNOSIS — U071 COVID-19: Secondary | ICD-10-CM | POA: Insufficient documentation

## 2020-05-21 DIAGNOSIS — E663 Overweight: Secondary | ICD-10-CM | POA: Diagnosis not present

## 2020-05-21 MED ORDER — SODIUM CHLORIDE 0.9 % IV SOLN: INTRAVENOUS | Status: DC | PRN

## 2020-05-21 MED ORDER — FAMOTIDINE IN NACL 20-0.9 MG/50ML-% IV SOLN: 20.0000 mg | Freq: Once | INTRAVENOUS | Status: DC | PRN

## 2020-05-21 MED ORDER — DIPHENHYDRAMINE HCL 50 MG/ML IJ SOLN: 50.0000 mg | Freq: Once | INTRAMUSCULAR | Status: DC | PRN

## 2020-05-21 MED ORDER — SODIUM CHLORIDE 0.9 % IV SOLN
1200.0000 mg | Freq: Once | INTRAVENOUS | Status: AC
  Administered 2020-05-21: 1200 mg via INTRAVENOUS
  Filled 2020-05-21: qty 10

## 2020-05-21 MED ORDER — METHYLPREDNISOLONE SODIUM SUCC 125 MG IJ SOLR: 125.0000 mg | Freq: Once | INTRAMUSCULAR | Status: DC | PRN

## 2020-05-21 MED ORDER — EPINEPHRINE 0.3 MG/0.3ML IJ SOAJ: 0.3000 mg | Freq: Once | INTRAMUSCULAR | Status: DC | PRN

## 2020-05-21 MED ORDER — ALBUTEROL SULFATE HFA 108 (90 BASE) MCG/ACT IN AERS: 2.0000 | INHALATION_SPRAY | Freq: Once | RESPIRATORY_TRACT | Status: DC | PRN

## 2020-05-21 NOTE — Progress Notes (Signed)
  Diagnosis: COVID-19  Physician: Patrick Wright, MD  Procedure: Cov[facility] Infusion Clinic Med: casirivimab\imdevimab infusion - Provided patient with casirivimab\imdevimab fact sheet for patients, parents and caregivers prior to infusion.  Complications: No immediate complications noted.  Discharge: Discharged home   Jonathan Costa 05/21/2020  

## 2020-05-21 NOTE — Discharge Instructions (Signed)

## 2020-05-24 ENCOUNTER — Telehealth: Payer: Self-pay | Admitting: Family Medicine

## 2020-05-24 NOTE — Telephone Encounter (Signed)
Error

## 2020-05-24 NOTE — Telephone Encounter (Signed)
FYI

## 2020-05-24 NOTE — Telephone Encounter (Signed)
Please advise 

## 2020-05-24 NOTE — Telephone Encounter (Signed)
Patient would like approval to [facility] from [facility] due to [facility] seeing the rest of the family.  Please advise ?  Patient also requested to change the app for his covid follow up from [facility] this week before Friday  Please advise

## 2020-05-24 NOTE — Telephone Encounter (Signed)
Fine with me- great guy. Tell him thank you for the opportunity to be his doctor these years

## 2020-05-25 NOTE — Progress Notes (Deleted)
Phone [phone] In person visit   Subjective:   Jonathan Costa is a 49 y.o. year old very pleasant male patient who presents for/with See problem oriented charting No chief complaint on file.   This visit occurred during the SARS-CoV-2 public health emergency.  Safety protocols were in place, including screening questions prior to the visit, additional usage of staff PPE, and extensive cleaning of exam room while observing appropriate contact time as indicated for disinfecting solutions.   Past Medical History-  Patient Active Problem List   Diagnosis Date Noted  . 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 Obstructive sleep apnea 12/17/2018  . Erectile dysfunction 12/02/2018  . Non-recurrent unilateral inguinal hernia without obstruction or gangrene   . 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 Low iron 10/16/2016  . Hernia, inguinal, right 10/16/2016  . Bleeding from the nose 05/14/2015  . Seasonal and perennial allergic rhinitis 05/13/2015  . Vertigo 05/13/2015  . Herpes zoster 06/08/2010  . Depression 07/03/2008    Medications- reviewed and updated Current Outpatient Medications  Medication Sig Dispense Refill  . acetaminophen (TYLENOL) 500 MG tablet Take 500 mg by mouth every 6 (six) hours as needed.    Marland Kitchen albuterol (PROAIR HFA) 108 (90 Base) MCG/ACT inhaler Inhale 2 puffs into the lungs every 6 (six) hours as needed for wheezing or shortness of breath. 1 each 0  . benzonatate (TESSALON PERLES) 100 MG capsule Take 1 capsule (100 mg total) by mouth 3 (three) times daily as needed. 20 capsule 0  . buPROPion (WELLBUTRIN XL) 150 MG 24 hr tablet TAKE 1 TABLET BY MOUTH EVERY DAY 30 tablet 2  . cetirizine (ZYRTEC) 10 MG tablet Take 10 mg by mouth daily.    . citalopram (CELEXA) 10 MG tablet Take 1 tablet (10 mg total) by mouth daily. 90 tablet 3  . ketoconazole (NIZORAL) 2 % cream Apply 1 application topically daily. 30 g 0  . methocarbamol (ROBAXIN) 500 MG tablet TAKE 1 TABLET 3 TIMES  A DAY AS NEEDED FOR MUSCLE SPASM 90 tablet 2  . montelukast (SINGULAIR) 10 MG tablet Take 10 mg by mouth at bedtime.    . Multiple Vitamin (MULTI-VITAMIN DAILY PO) Take 1 tablet by mouth daily.    . Multiple Vitamins-Minerals (VITAMIN D3 COMPLETE PO) Take 1 capsule by mouth daily.    . naproxen sodium (ANAPROX) 220 MG tablet Take 220 mg by mouth 2 (two) times daily with a meal.    . omeprazole (PRILOSEC OTC) 20 MG tablet Take 20 mg by mouth daily as needed.    . tadalafil (CIALIS) 20 MG tablet TAKE 1 TABLET (20 MG TOTAL) BY MOUTH EVERY OTHER DAY AS NEEDED FOR ERECTILE DYSFUNCTION. 15 tablet 2  . zolpidem (AMBIEN) 5 MG tablet Take 1 tablet (5 mg total) by mouth at bedtime as needed for sleep. 30 tablet 0   No current facility-administered medications for this visit.     Objective:  There were no vitals taken for this visit. Gen: NAD, resting comfortably CV: RRR no murmurs rubs or gallops Lungs: CTAB no crackles, wheeze, rhonchi Abdomen: soft/nontender/nondistended/normal bowel sounds. No rebound or guarding.  Ext: no edema Skin: warm, dry Neuro: grossly normal, moves all extremities  ***    Assessment and Plan  Covid F/U S:***  A/P: ***     ***lost dad to kidney cancer 9/ 27/2020 or 06/2019  No problem-specific Assessment & Plan notes found for this encounter.   Recommended follow up: ***No follow-ups on file. Future Appointments  Date Time  Provider [place]  05/26/2020 10:00 AM Marin Olp, MD [facility]  04/03/2021  1:30 PM Baird Lyons D, MD [facility] None    Lab/Order associations: No diagnosis found.  No orders of the defined types were placed in this encounter.   Time Spent: *** minutes of total time (9:51 AM***- 9:51 AM***) was spent on the date of the encounter performing the following actions: chart review prior to seeing the patient, obtaining history, performing a medically necessary exam, counseling on the treatment plan, placing  orders, and documenting in our EHR.   Return precautions advised.  Clyde Lundborg, CMA

## 2020-05-25 NOTE — Telephone Encounter (Signed)
Yes, please put on my schedule for covid follow up as well. Have been in touch with his wife as well.  Orma Flaming, MD [place]

## 2020-05-25 NOTE — Telephone Encounter (Signed)
Please see message below

## 2020-05-25 NOTE — Telephone Encounter (Signed)
Pt scheduled  

## 2020-05-26 ENCOUNTER — Ambulatory Visit: Payer: Commercial Managed Care - PPO | Admitting: Family Medicine

## 2020-05-26 ENCOUNTER — Encounter: Payer: Self-pay | Admitting: Family Medicine

## 2020-05-26 ENCOUNTER — Other Ambulatory Visit: Payer: Self-pay

## 2020-05-26 VITALS — BP 124/80 | HR 71 | Temp 98.0°F | Ht 74.0 in | Wt 227.4 lb

## 2020-05-26 DIAGNOSIS — U071 COVID-19: Secondary | ICD-10-CM

## 2020-05-26 MED ORDER — PREDNISONE 20 MG PO TABS: 40.0000 mg | ORAL_TABLET | Freq: Every day | ORAL | 0 refills | Status: DC

## 2020-05-26 NOTE — Progress Notes (Signed)
Patient: Jonathan Costa MRN: [id number] DOB: [birth year] PCP: Orma Flaming, MD     Subjective:  Chief Complaint  Patient presents with  . Follow-up    Covid 19- 3 weeks out    HPI: The patient is a 49 y.o. male who presents today for Covid follow up.  He got his vaccine on 05/04/20. He started to have symptoms about 10 days later. He tested positive for covid with home test on 8/26 and got the infusion on 8/28. He got quite sick post infusion, but has been recovering since that time. Is back to about 80%. He is feeling better. He still has a dull headache and sinus pressure. His cough is dry and very intermittent in nature. He has no fever/vomiting anymore. No sob/wheezing.    Review of Systems  Constitutional: Negative for fatigue and fever.  HENT: Positive for sinus pressure.   Respiratory: Positive for cough. Negative for chest tightness and shortness of breath.   Cardiovascular: Negative for chest pain and palpitations.  Neurological: Positive for headaches. Negative for dizziness.    Allergies Patient is allergic to latex and dye fdc red [red dye].  Past Medical History Patient  has a past medical history of Allergy, Arthritis, DEPRESSION (07/03/2008), History of kidney stones, and SHINGLES (06/08/2010).  Surgical History Patient  has a past surgical history that includes Hip surgery (Right, 2012) and Inguinal hernia repair (Right, 05/24/2017).  Family History Pateint's family history includes Cancer (age of onset: 63) in his mother; Cancer (age of onset: 76) in his father; Henoch-Schonlein purpura in his daughter; Hypertension in his father.  Social History Patient  reports that he has never smoked. He has never used smokeless tobacco. He reports current alcohol use. He reports that he does not use drugs.    Objective: Vitals:   05/26/20 0934  BP: 124/80  Pulse: 71  Temp: 98 F (36.7 C)  TempSrc: Temporal  SpO2: 93%  Weight: 227 lb 6.4 oz (103.1 kg)  Height: 6'  2" (1.88 m)    Body mass index is 29.2 kg/m.  Physical Exam Vitals reviewed.  Constitutional:      Appearance: Normal appearance. He is normal weight.  HENT:     Head: Normocephalic and atraumatic.     Comments: TTP over bilateral maxillary sinuses.     Right Ear: Tympanic membrane, ear canal and external ear normal.     Left Ear: Tympanic membrane, ear canal and external ear normal.     Mouth/Throat:     Mouth: Mucous membranes are moist.  Eyes:     Extraocular Movements: Extraocular movements intact.     Pupils: Pupils are equal, round, and reactive to light.  Cardiovascular:     Rate and Rhythm: Normal rate and regular rhythm.     Heart sounds: Normal heart sounds.  Pulmonary:     Effort: Pulmonary effort is normal.  Abdominal:     General: Bowel sounds are normal.     Palpations: Abdomen is soft.  Musculoskeletal:     Cervical back: Normal range of motion and neck supple.  Lymphadenopathy:     Cervical: No cervical adenopathy.  Neurological:     General: No focal deficit present.     Mental Status: He is alert and oriented to person, place, and time.  Psychiatric:        Mood and Affect: Mood normal.        Behavior: Behavior normal.        Assessment/plan: 1. COVID-19 I  personally do not feel like he really had covid and more of a reaction to the vaccine. Can see people mimic covid disease about 8+days after vaccine and are having more of a reaction to build of spike proteins. He is doing well and nearly back to baseline. Still having some sinus congestion/pressure. Can not use flonase due to nose bleeds and is flying to [place] soon. Will do steroid burst to help sinus congestion.   Chart and HM reviewed. F/u for annual or as needed. Will need referral for cscope at that time.    This visit occurred during the SARS-CoV-2 public health emergency.  Safety protocols were in place, including screening questions prior to the visit, additional usage of staff PPE, and  extensive cleaning of exam room while observing appropriate contact time as indicated for disinfecting solutions.    Return if symptoms worsen or fail to improve.   Orma Flaming, MD [place]   05/26/2020

## 2020-05-26 NOTE — Patient Instructions (Signed)
-  steroid burst to help sinus congestion. Let me know if mucous  Gets worse.   I still think that you had reaction to vaccine and not truly covid.   Keep me posted! Jonathan Costa

## 2020-06-07 ENCOUNTER — Other Ambulatory Visit: Payer: Self-pay

## 2020-06-07 ENCOUNTER — Other Ambulatory Visit: Payer: Commercial Managed Care - PPO

## 2020-06-07 DIAGNOSIS — Z20822 Contact with and (suspected) exposure to covid-19: Secondary | ICD-10-CM

## 2020-06-09 LAB — NOVEL CORONAVIRUS, NAA: SARS-CoV-2, NAA: NOT DETECTED

## 2020-06-09 LAB — SARS-COV-2, NAA 2 DAY TAT

## 2020-06-19 ENCOUNTER — Other Ambulatory Visit: Payer: Self-pay | Admitting: Family Medicine

## 2020-06-23 ENCOUNTER — Other Ambulatory Visit: Payer: Self-pay | Admitting: Family Medicine

## 2020-07-21 ENCOUNTER — Other Ambulatory Visit: Payer: Self-pay | Admitting: Family Medicine

## 2020-07-24 ENCOUNTER — Other Ambulatory Visit: Payer: Self-pay | Admitting: Family Medicine

## 2020-10-20 ENCOUNTER — Encounter: Payer: Self-pay | Admitting: Family Medicine

## 2020-10-20 ENCOUNTER — Ambulatory Visit: Payer: Commercial Managed Care - PPO | Admitting: Family Medicine

## 2020-10-20 ENCOUNTER — Other Ambulatory Visit: Payer: Self-pay

## 2020-10-20 VITALS — BP 134/84 | HR 74 | Temp 98.4°F | Ht 74.0 in | Wt 228.2 lb

## 2020-10-20 DIAGNOSIS — J329 Chronic sinusitis, unspecified: Secondary | ICD-10-CM

## 2020-10-20 DIAGNOSIS — B9789 Other viral agents as the cause of diseases classified elsewhere: Secondary | ICD-10-CM | POA: Diagnosis not present

## 2020-10-20 DIAGNOSIS — M26609 Unspecified temporomandibular joint disorder, unspecified side: Secondary | ICD-10-CM | POA: Diagnosis not present

## 2020-10-20 MED ORDER — PREDNISONE 20 MG PO TABS: 40.0000 mg | ORAL_TABLET | Freq: Every day | ORAL | 0 refills | Status: DC

## 2020-10-20 MED ORDER — METHOCARBAMOL 500 MG PO TABS: 500.0000 mg | ORAL_TABLET | Freq: Three times a day (TID) | ORAL | 2 refills | Status: DC | PRN

## 2020-10-20 NOTE — Progress Notes (Signed)
Patient: Jonathan Costa MRN: [id number] DOB: [birth year] PCP: Orma Flaming, MD     Subjective:  Chief Complaint  Patient presents with  . Sinus Problem    Symptoms started Sunday.    HPI: The patient is a 50 y.o. male who presents today for sinus problems. Symptoms started Sunday with drainage down his throat. He woke up yesterday with sharp pain in his right eye and around his sinuses. Yesterday and last night he has a hard time clearing his right ear. He has used mucinex. He has not used flonase as he gets nose bleeds. No fever/chills. No pain in his teeth. His daughter had covid a few weeks ago. Other than that he feels fine.   Review of Systems  Constitutional: Negative for chills and fever.  HENT: Positive for postnasal drip, sinus pressure and sinus pain. Negative for congestion and sore throat.     Allergies Patient is allergic to latex and dye fdc red [red dye].  Past Medical History Patient  has a past medical history of Allergy, Arthritis, DEPRESSION (07/03/2008), History of kidney stones, and SHINGLES (06/08/2010).  Surgical History Patient  has a past surgical history that includes Hip surgery (Right, 2012) and Inguinal hernia repair (Right, 05/24/2017).  Family History Pateint's family history includes Cancer (age of onset: 67) in his mother; Cancer (age of onset: 77) in his father; Henoch-Schonlein purpura in his daughter; Hypertension in his father.  Social History Patient  reports that he has never smoked. He has never used smokeless tobacco. He reports current alcohol use. He reports that he does not use drugs.    Objective: Vitals:   10/20/20 1109  BP: 134/84  Pulse: 74  Temp: 98.4 F (36.9 C)  TempSrc: Temporal  SpO2: 97%  Weight: 228 lb 3.2 oz (103.5 kg)  Height: 6\' 2"  (1.88 m)    Body mass index is 29.3 kg/m.  Physical Exam Vitals reviewed.  Constitutional:      Appearance: Normal appearance. He is normal weight.  HENT:     Head:  Normocephalic and atraumatic.     Comments: ttp over right maxillary sinus     Right Ear: Tympanic membrane, ear canal and external ear normal.     Left Ear: Tympanic membrane, ear canal and external ear normal.     Nose: Nose normal.     Mouth/Throat:     Mouth: Mucous membranes are moist.     Comments: +cobblestoning on posterior pharynx  Eyes:     Extraocular Movements: Extraocular movements intact.     Pupils: Pupils are equal, round, and reactive to light.  Cardiovascular:     Rate and Rhythm: Normal rate and regular rhythm.     Heart sounds: Normal heart sounds.  Pulmonary:     Effort: Pulmonary effort is normal.     Breath sounds: Normal breath sounds.  Abdominal:     General: Bowel sounds are normal.     Palpations: Abdomen is soft.  Musculoskeletal:     Cervical back: Normal range of motion and neck supple.  Lymphadenopathy:     Cervical: No cervical adenopathy.  Skin:    Capillary Refill: Capillary refill takes less than 2 seconds.  Neurological:     General: No focal deficit present.     Mental Status: He is alert and oriented to person, place, and time.  Psychiatric:        Mood and Affect: Mood normal.        Behavior: Behavior normal.  Assessment/plan: 1. Viral sinusitis/PND Exam wnl. Continue with conservative therapy at this point. Can't do steroid nasal sprays, but has cpap with cool mist humidifier. Will send in steroid burst, but do not think he needs at this point. He will not fill. They are to let me know if any issues, fever, worsening pressure but at this point no further intervention needed.   2. Temporomandibular dysfunction syndrome  - methocarbamol (ROBAXIN) 500 MG tablet; Take 1 tablet (500 mg total) by mouth every 8 (eight) hours as needed for muscle spasms.  Dispense: 90 tablet; Refill: 2    This visit occurred during the SARS-CoV-2 public health emergency.  Safety protocols were in place, including screening questions prior to the  visit, additional usage of staff PPE, and extensive cleaning of exam room while observing appropriate contact time as indicated for disinfecting solutions.     Return if symptoms worsen or fail to improve.    Orma Flaming, MD [place]   10/20/2020

## 2020-10-20 NOTE — Patient Instructions (Signed)
-  I think this is viral in nature. I dont' think you need antibiotics at all.   -can send in steroid burst for you to take if pressure gets worse. But I would try to ride this out conservatively.   -let me know if not getting better and we can do antibiotics if needed. Have meg text me.   -you sound and look good!   Aw

## 2021-01-14 ENCOUNTER — Other Ambulatory Visit: Payer: Self-pay | Admitting: Family Medicine

## 2021-02-01 ENCOUNTER — Other Ambulatory Visit: Payer: Self-pay | Admitting: Family Medicine
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
## 2021-04-03 ENCOUNTER — Other Ambulatory Visit: Payer: Self-pay

## 2021-04-03 ENCOUNTER — Ambulatory Visit: Payer: Commercial Managed Care - PPO | Admitting: Internal Medicine

## 2021-04-03 ENCOUNTER — Encounter: Payer: Self-pay | Admitting: Internal Medicine

## 2021-04-03 DIAGNOSIS — M26609 Unspecified temporomandibular joint disorder, unspecified side: Secondary | ICD-10-CM | POA: Diagnosis not present

## 2021-04-03 DIAGNOSIS — G4733 Obstructive sleep apnea (adult) (pediatric): Secondary | ICD-10-CM

## 2021-04-03 NOTE — Patient Instructions (Signed)
We can continue CPAP auto 5-20  Please call if we can help 

## 2021-04-03 NOTE — Assessment & Plan Note (Signed)
This would probably be a contraindication to a fitted oral appliance alternative for OSA.

## 2021-04-03 NOTE — Assessment & Plan Note (Signed)
Benefits from CPAP with good compliance and control Plan- continue auto 5-20 

## 2021-06-21 ENCOUNTER — Other Ambulatory Visit: Payer: Self-pay | Admitting: Family Medicine

## 2021-06-28 ENCOUNTER — Other Ambulatory Visit: Payer: Self-pay | Admitting: Family Medicine

## 2021-07-03 ENCOUNTER — Other Ambulatory Visit: Payer: Self-pay

## 2021-07-03 ENCOUNTER — Telehealth: Payer: Self-pay

## 2021-07-03 MED ORDER — TADALAFIL 20 MG PO TABS: 20.0000 mg | ORAL_TABLET | ORAL | 1 refills | Status: DC | PRN

## 2021-07-03 NOTE — Telephone Encounter (Signed)
Sent to pharmacy 

## 2021-07-03 NOTE — Telephone Encounter (Signed)
LAST APPOINTMENT DATE:  10/20/20  NEXT APPOINTMENT DATE: 11/7 TOC from [facility]  MEDICATION:tadalafil (CIALIS) 20 MG tablet  PHARMACY: [id number] - [place], [place] - [address] AT [place]

## 2021-07-05 ENCOUNTER — Other Ambulatory Visit: Payer: Self-pay

## 2021-07-05 MED ORDER — CITALOPRAM HYDROBROMIDE 10 MG PO TABS
10.0000 mg | ORAL_TABLET | Freq: Every day | ORAL | 0 refills | Status: DC
Start: 2021-07-05 — End: 2021-07-31

## 2021-07-05 NOTE — Telephone Encounter (Signed)
Pt called stating that [facility] has not [facility] the request from [place]. Can the medication be resent? Please Advise.

## 2021-07-05 NOTE — Telephone Encounter (Signed)
Denied. I spoke with the pharmacy to address refill concerns. Pt has picked up prescription 2 days ago.

## 2021-07-27 ENCOUNTER — Other Ambulatory Visit: Payer: Self-pay | Admitting: Family

## 2021-07-31 ENCOUNTER — Ambulatory Visit: Payer: Commercial Managed Care - PPO | Admitting: Family

## 2021-07-31 ENCOUNTER — Other Ambulatory Visit: Payer: Self-pay

## 2021-07-31 ENCOUNTER — Encounter: Payer: Self-pay | Admitting: Family

## 2021-07-31 VITALS — BP 144/98 | HR 64 | Temp 98.2°F | Ht 74.0 in | Wt 232.4 lb

## 2021-07-31 DIAGNOSIS — M26609 Unspecified temporomandibular joint disorder, unspecified side: Secondary | ICD-10-CM | POA: Diagnosis not present

## 2021-07-31 DIAGNOSIS — N5201 Erectile dysfunction due to arterial insufficiency: Secondary | ICD-10-CM | POA: Diagnosis not present

## 2021-07-31 DIAGNOSIS — F3342 Major depressive disorder, recurrent, in full remission: Secondary | ICD-10-CM

## 2021-07-31 DIAGNOSIS — R030 Elevated blood-pressure reading, without diagnosis of hypertension: Secondary | ICD-10-CM | POA: Diagnosis not present

## 2021-07-31 MED ORDER — TADALAFIL 20 MG PO TABS: 20.0000 mg | ORAL_TABLET | ORAL | 3 refills | Status: DC | PRN

## 2021-07-31 MED ORDER — METHOCARBAMOL 500 MG PO TABS: 500.0000 mg | ORAL_TABLET | Freq: Three times a day (TID) | ORAL | 0 refills | Status: AC | PRN

## 2021-07-31 MED ORDER — CITALOPRAM HYDROBROMIDE 10 MG PO TABS: 10.0000 mg | ORAL_TABLET | Freq: Every day | ORAL | 1 refills | Status: DC
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
## 2021-07-31 NOTE — Patient Instructions (Addendum)
It was very nice to meet you today!  Please schedule an annual physical with fasting labs at your convenience. Your refills have been sent to your pharmacy.   PLEASE NOTE:  If you had any lab tests please let us know if you have not heard back within a few days. You may see your results on mychart before we have a chance to review them but we will give you a call once they are reviewed by [place]. If we ordered any referrals today, please let us know if you have not heard from their office within the next week.   Please try these tips to maintain a healthy lifestyle:  Eat most of your calories during the day when you are active. Eliminate processed foods including packaged sweets (pies, cakes, cookies), reduce intake of potatoes, white bread, white pasta, and white rice. Look for whole grain options, oat flour or almond flour.  Each meal should contain half fruits/vegetables, one quarter protein, and one quarter carbs (no bigger than a computer mouse).  Cut down on sweet beverages. This includes juice, soda, and sweet tea. Also watch fruit intake, though this is a healthier sweet option, it still contains natural sugar! Limit to 3 servings daily.  Drink at least 1 glass of water with each meal and aim for at least 8 glasses per day  Exercise at least 150 minutes every week.

## 2021-07-31 NOTE — Assessment & Plan Note (Signed)
Elevated today, also several in hx, pt denies sx, will continue to monitor, advised monitoring at home, if readings are consistently >130/90, encourage he call the office. Also advised on scheduling annual physical w/fasting labs.

## 2022-01-23 ENCOUNTER — Other Ambulatory Visit: Payer: Self-pay | Admitting: Family

## 2022-01-23 DIAGNOSIS — F3342 Major depressive disorder, recurrent, in full remission: Secondary | ICD-10-CM

## 2022-02-21 ENCOUNTER — Other Ambulatory Visit: Payer: Self-pay | Admitting: Family

## 2022-02-21 DIAGNOSIS — F3342 Major depressive disorder, recurrent, in full remission: Secondary | ICD-10-CM

## 2022-03-31 NOTE — Progress Notes (Unsigned)
HPI 8 yoM never smoker, followed for OSA, complicated by allergic rhinitis, TMJ, vertigo, depression, hx trauma(tree fell on him) HST 01/29/2019- AHI 34.9/ hr, desaturation to 77%, body weight 231 lbs --------------------------------------------------------------------------------   04/03/21- 3 yoM never smoker, followed for OSA, complicated by allergic rhinitis, TMJ, vertigo, depression, hx trauma(tree fell on him) CPAP auto 5-20/ [facility] Download-compliance 1005, AHI 0.6/ hr Body weight today-230 lbs Covid vax- J&J We reviewed medical indications and benefits of CPAP again. Discussed Inspire alternative. Wife is happy with his CPAP. Sleeping well with no concerns.Hollace Kinnier reviewed.  04/02/22- 58 yoM never smoker, followed for OSA, complicated by allergic rhinitis, TMJ, vertigo, depression, hx trauma(tree fell on him) CPAP auto 5-20/ [facility] Download-compliance  Body weight today- Covid vax- J&J    ROS-see HPI   + = positive Constitutional:    weight loss, night sweats, fevers, chills, fatigue, lassitude. HEENT:    headaches, difficulty swallowing, tooth/dental problems, sore throat,       sneezing, itching, ear ache, nasal congestion, post nasal drip, snoring CV:    chest pain, orthopnea, PND, swelling in lower extremities, anasarca,                                   dizziness, palpitations Resp:   shortness of breath with exertion or at rest.                productive cough,   non-productive cough, coughing up of blood.              change in color of mucus.  wheezing.   Skin:    rash or lesions. GI:  No-   heartburn, indigestion, abdominal pain, nausea, vomiting, diarrhea,                 change in bowel habits, loss of appetite GU: dysuria, change in color of urine, no urgency or frequency.   flank pain. MS:   joint pain, stiffness, decreased range of motion, back pain. Neuro-     nothing unusual Psych:  change in mood or affect.  depression or anxiety.   memory loss.  OBJ-  Physical Exam General- Alert, Oriented, Affect-appropriate, Distress- none acute, lean/ fit appearing Skin- rash-none, lesions- none, excoriation- none Lymphadenopathy- none Head- atraumatic            Eyes- Gross vision intact, PERRLA, conjunctivae and secretions clear            Ears- Hearing, canals-normal            Nose- Clear, +Septal dev, mucus, polyps, erosion, perforation             Throat- Mallampati II , mucosa clear , drainage- none, tonsils- atrophic, + own teeth Neck- flexible , trachea midline, no stridor , thyroid nl, carotid no bruit Chest - symmetrical excursion , unlabored           Heart/CV- RRR , no murmur , no gallop  , no rub, nl s1 s2                           - JVD- none , edema- none, stasis changes- none, varices- none           Lung- clear to P&A, wheeze- none, cough- none , dullness-none, rub- none           Chest wall-  Abd-  Br/ Gen/  Rectal- Not done, not indicated Extrem- cyanosis- none, clubbing, none, atrophy- none, strength- nl Neuro- grossly intact to observation

## 2022-04-02 ENCOUNTER — Ambulatory Visit: Payer: Commercial Managed Care - PPO | Admitting: Internal Medicine

## 2022-04-02 ENCOUNTER — Encounter: Payer: Self-pay | Admitting: Internal Medicine

## 2022-04-02 VITALS — BP 126/78 | HR 75 | Temp 97.5°F | Ht 74.0 in | Wt 231.4 lb

## 2022-04-02 DIAGNOSIS — G4733 Obstructive sleep apnea (adult) (pediatric): Secondary | ICD-10-CM | POA: Diagnosis not present

## 2022-04-02 DIAGNOSIS — J302 Other seasonal allergic rhinitis: Secondary | ICD-10-CM | POA: Diagnosis not present

## 2022-04-02 DIAGNOSIS — J3089 Other allergic rhinitis: Secondary | ICD-10-CM

## 2022-04-02 NOTE — Patient Instructions (Signed)
Order- DME [facility]- please replace old CPAP machine, mask of choice, humidifier, supplies, AirView/ card  Please call if we can help

## 2022-04-03 NOTE — Assessment & Plan Note (Signed)
Benefits from CPAP with good compliance and control Plan-continue AutoPap 5-20 

## 2022-04-03 NOTE — Assessment & Plan Note (Signed)
Managed by his allergist.  It is not interfering with his CPAP and currently not a significant issue during the summer.

## 2022-06-18 ENCOUNTER — Encounter: Payer: Self-pay | Admitting: *Deleted

## 2022-08-22 ENCOUNTER — Other Ambulatory Visit: Payer: Self-pay | Admitting: Family

## 2022-08-22 DIAGNOSIS — N5201 Erectile dysfunction due to arterial insufficiency: Secondary | ICD-10-CM

## 2022-09-06 ENCOUNTER — Encounter: Payer: Self-pay | Admitting: *Deleted

## 2022-10-19 ENCOUNTER — Other Ambulatory Visit: Payer: Self-pay | Admitting: Family Medicine

## 2022-10-19 DIAGNOSIS — E785 Hyperlipidemia, unspecified: Secondary | ICD-10-CM

## 2022-10-19 DIAGNOSIS — R931 Abnormal findings on diagnostic imaging of heart and coronary circulation: Secondary | ICD-10-CM

## 2022-11-23 ENCOUNTER — Ambulatory Visit
Admission: RE | Admit: 2022-11-23 | Discharge: 2022-11-23 | Disposition: A | Discharge status: Home / Self Care | Payer: No Typology Code available for payment source | Source: Ambulatory Visit | Attending: Family Medicine | Admitting: Family Medicine

## 2022-11-23 DIAGNOSIS — R931 Abnormal findings on diagnostic imaging of heart and coronary circulation: Secondary | ICD-10-CM

## 2022-11-23 DIAGNOSIS — E785 Hyperlipidemia, unspecified: Secondary | ICD-10-CM

## 2023-04-08 ENCOUNTER — Ambulatory Visit: Payer: Commercial Managed Care - PPO | Admitting: Internal Medicine

## 2023-05-19 NOTE — Progress Notes (Unsigned)
HPI 33 yoM never smoker, followed for OSA, complicated by allergic rhinitis, TMJ, vertigo, depression, hx trauma(tree fell on him) HST 01/29/2019- AHI 34.9/ hr, desaturation to 77%, body weight 231 lbs --------------------------------------------------------------------------------   04/02/22- 51 yoM never smoker, followed for OSA, complicated by allergic Rhinitis, TMJ/ Bruxism, vertigo, depression, hx trauma(tree fell on him) CPAP auto 5-20/ [facility]   AirSense 10 AutoSet Download-compliance 97%, AHI 0.2/ hr   range 6.4-10 cwp Body weight today-231 lbs Covid vax- J&J Download reviewed.  He reports being comfortable with CPAP and comfortable considering it long-term.  No recent medical issues or acute concerns.  05/20/23-52 yoM never smoker, followed for OSA, complicated by allergic Rhinitis, TMJ/ Bruxism, vertigo, depression, hx trauma(tree fell on him) -albuterol hfa CPAP auto 5-20/ [facility]   AirSense 10 AutoSet Download-compliance 100%, AHI 0.5/ hr Body weight today-235 lbs -----Pt is doing well. No issues with cpap machine  Download reviewed.  He is doing well with CPAP.  Considering getting a travel machine, anticipating trip to [place]. Seasonal allergic rhinitis is more often a problem in the spring.  Currently doing very well.  Pt is doing well. No issues with cpap machine ROS-see HPI   + = positive Constitutional:    weight loss, night sweats, fevers, chills, fatigue, lassitude. HEENT:    headaches, difficulty swallowing, tooth/dental problems, sore throat,       sneezing, itching, ear ache, nasal congestion, post nasal drip, snoring CV:    chest pain, orthopnea, PND, swelling in lower extremities, anasarca,                                   dizziness, palpitations Resp:   shortness of breath with exertion or at rest.                productive cough,   non-productive cough, coughing up of blood.              change in color of mucus.  wheezing.   Skin:    rash or lesions. GI:  No-    heartburn, indigestion, abdominal pain, nausea, vomiting, diarrhea,                 change in bowel habits, loss of appetite GU: dysuria, change in color of urine, no urgency or frequency.   flank pain. MS:   joint pain, stiffness, decreased range of motion, back pain. Neuro-     nothing unusual Psych:  change in mood or affect.  depression or anxiety.   memory loss.  OBJ- Physical Exam General- Alert, Oriented, Affect-appropriate, Distress- none acute, lean/ fit appearing Skin- rash-none, lesions- none, excoriation- none Lymphadenopathy- none Head- atraumatic            Eyes- Gross vision intact, PERRLA, conjunctivae and secretions clear            Ears- Hearing, canals-normal            Nose- Clear, +Septal dev, mucus, polyps, erosion, perforation             Throat- Mallampati II , mucosa clear , drainage- none, tonsils- atrophic, + own teeth Neck- flexible , trachea midline, no stridor , thyroid nl, carotid no bruit Chest - symmetrical excursion , unlabored           Heart/CV- RRR , no murmur , no gallop  , no rub, nl s1 s2                           -  JVD- none , edema- none, stasis changes- none, varices- none           Lung- clear to P&A, wheeze- none, cough- none , dullness-none, rub- none           Chest wall-  Abd-  Br/ Gen/ Rectal- Not done, not indicated Extrem- cyanosis- none, clubbing, none, atrophy- none, strength- nl Neuro- grossly intact to observation

## 2023-05-20 ENCOUNTER — Encounter: Payer: Self-pay | Admitting: Internal Medicine

## 2023-05-20 ENCOUNTER — Ambulatory Visit: Payer: Commercial Managed Care - PPO | Admitting: Internal Medicine

## 2023-05-20 VITALS — BP 110/66 | HR 68 | Ht 74.0 in | Wt 235.4 lb

## 2023-05-20 DIAGNOSIS — G4733 Obstructive sleep apnea (adult) (pediatric): Secondary | ICD-10-CM

## 2023-05-20 DIAGNOSIS — J302 Other seasonal allergic rhinitis: Secondary | ICD-10-CM | POA: Diagnosis not present

## 2023-05-20 DIAGNOSIS — J3089 Other allergic rhinitis: Secondary | ICD-10-CM

## 2023-05-20 NOTE — Assessment & Plan Note (Signed)
Benefits from CPAP with good compliance and control Plan-continue auto 5-20.  We discussed potential purchase of travel CPAP machine.

## 2023-05-20 NOTE — Assessment & Plan Note (Signed)
Currently doing very well.  Usually more problems in the spring.

## 2023-05-20 NOTE — Patient Instructions (Signed)
We can continue CPAP auto 5-20  Please call if we can help 

## 2023-05-28 ENCOUNTER — Encounter: Payer: Self-pay | Admitting: Internal Medicine

## 2023-08-07 ENCOUNTER — Other Ambulatory Visit: Payer: Self-pay | Admitting: Family Medicine

## 2023-08-07 DIAGNOSIS — K111 Hypertrophy of salivary gland: Secondary | ICD-10-CM

## 2023-08-15 ENCOUNTER — Ambulatory Visit
Admission: RE | Admit: 2023-08-15 | Discharge: 2023-08-15 | Disposition: A | Discharge status: Home / Self Care | Payer: No Typology Code available for payment source | Source: Ambulatory Visit | Attending: Family Medicine | Admitting: Family Medicine

## 2023-08-15 DIAGNOSIS — K111 Hypertrophy of salivary gland: Secondary | ICD-10-CM

## 2023-08-15 MED ORDER — IOPAMIDOL (ISOVUE-300) INJECTION 61%
500.0000 mL | Freq: Once | INTRAVENOUS | Status: AC | PRN
  Administered 2023-08-15: 75 mL via INTRAVENOUS

## 2024-03-30 ENCOUNTER — Other Ambulatory Visit: Payer: Self-pay | Admitting: Family Medicine

## 2024-03-30 DIAGNOSIS — N5082 Scrotal pain: Secondary | ICD-10-CM

## 2024-04-01 ENCOUNTER — Ambulatory Visit
Admission: RE | Admit: 2024-04-01 | Discharge: 2024-04-01 | Disposition: A | Discharge status: Home / Self Care | Source: Ambulatory Visit | Attending: Family Medicine | Admitting: Family Medicine

## 2024-04-01 DIAGNOSIS — N5082 Scrotal pain: Secondary | ICD-10-CM

## 2024-05-18 ENCOUNTER — Ambulatory Visit: Payer: Commercial Managed Care - PPO | Admitting: Internal Medicine

## 2024-07-06 NOTE — Progress Notes (Unsigned)
 HPI 54 yoM never smoker, followed for OSA, complicated by allergic rhinitis, TMJ, vertigo, depression, hx trauma(tree fell on him) HST 01/29/2019- AHI 34.9/ hr, desaturation to 77%, body weight 231 lbs --------------------------------------------------------------------------------  05/20/23-52 yoM never smoker, followed for OSA, complicated by allergic Rhinitis, TMJ/ Bruxism, vertigo, depression, hx trauma(tree fell on him) -albuterol  hfa CPAP auto 5-20/ [facility]   AirSense 10 AutoSet Download-compliance 100%, AHI 0.5/ hr Body weight today-235 lbs -----Pt is doing well. No issues with cpap machine  Download reviewed.  He is doing well with CPAP.  Considering getting a travel machine, anticipating trip to [place]. Seasonal allergic rhinitis is more often a problem in the spring.  Currently doing very well.  07/07/24- 53 yoM never smoker, followed for OSA, complicated by allergic Rhinitis, TMJ/ Bruxism, vertigo, depression, hx trauma(tree fell on him) -albuterol  hfa CPAP auto 5-20/ [facility]   AirSense 10 AutoSet Download-compliance 80%, AHI 0.7/hr Body weight today-230 lbs Discussed the use of AI scribe software for clinical note transcription with the patient, who gave verbal consent to proceed.  History of Present Illness   Jonathan Costa is a 53 year old male with obstructive sleep apnea who presents for routine follow-up regarding CPAP use.  He uses a CPAP machine with a pressure range of 5 to 20 cm H2O, averaging around 10 cm H2O, and is comfortable with it. He has no new concerns regarding his CPAP use.  During a recent trip to [place], he used a travel CPAP machine, which is smaller and lacks a humidifier reservoir. He used a sponge to capture exhaled moisture and reports that the travel CPAP functioned well.  He is sleeping well and has no new symptoms or concerns.     Assessment and Plan:    Obstructive sleep apnea Well-controlled with CPAP therapy. Average pressure 10 cm H2O. Less than  one apnea per hour. CPAP machine 65-2 years old. - Continue CPAP therapy with pressure settings of 5 to 20 cm H2O. - Inquire about CPAP machine replacement eligibility after 5 years. - Follow up in one year with sleep or pulmonology specialist.           ROS-see HPI   + = positive Constitutional:    weight loss, night sweats, fevers, chills, fatigue, lassitude. HEENT:    headaches, difficulty swallowing, tooth/dental problems, sore throat,       sneezing, itching, ear ache, nasal congestion, post nasal drip, snoring CV:    chest pain, orthopnea, PND, swelling in lower extremities, anasarca,                                   dizziness, palpitations Resp:   shortness of breath with exertion or at rest.                productive cough,   non-productive cough, coughing up of blood.              change in color of mucus.  wheezing.   Skin:    rash or lesions. GI:  No-   heartburn, indigestion, abdominal pain, nausea, vomiting, diarrhea,                 change in bowel habits, loss of appetite GU: dysuria, change in color of urine, no urgency or frequency.   flank pain. MS:   joint pain, stiffness, decreased range of motion, back pain. Neuro-     nothing unusual Psych:  change in mood or affect.  depression or anxiety.   memory loss.  OBJ- Physical Exam General- Alert, Oriented, Affect-appropriate, Distress- none acute, lean/ fit appearing Skin- rash-none, lesions- none, excoriation- none Lymphadenopathy- none Head- atraumatic            Eyes- Gross vision intact, PERRLA, conjunctivae and secretions clear            Ears- Hearing, canals-normal            Nose- Clear, +Septal dev, mucus, polyps, erosion, perforation             Throat- Mallampati II , mucosa clear , drainage- none, tonsils- atrophic, + own teeth Neck- flexible , trachea midline, no stridor , thyroid nl, carotid no bruit Chest - symmetrical excursion , unlabored           Heart/CV- RRR , no murmur , no gallop  , no rub,  nl s1 s2                           - JVD- none , edema- none, stasis changes- none, varices- none           Lung- clear to P&A, wheeze- none, cough- none , dullness-none, rub- none           Chest wall-  Abd-  Br/ Gen/ Rectal- Not done, not indicated Extrem- cyanosis- none, clubbing, none, atrophy- none, strength- nl Neuro- grossly intact to observation

## 2024-07-07 ENCOUNTER — Encounter: Payer: Self-pay | Admitting: Internal Medicine

## 2024-07-07 ENCOUNTER — Ambulatory Visit: Admitting: Internal Medicine

## 2024-07-07 VITALS — BP 122/72 | HR 65 | Temp 98.4°F | Ht 74.0 in | Wt 230.6 lb

## 2024-07-07 DIAGNOSIS — G4733 Obstructive sleep apnea (adult) (pediatric): Secondary | ICD-10-CM

## 2024-07-07 NOTE — Patient Instructions (Signed)
  Glad you are doing well. We can continue CPAP 5-20. You can ask your DME company when you will be eligible for a new machine- usually after 5 years.

## 2024-07-08 ENCOUNTER — Encounter: Payer: Self-pay | Admitting: Internal Medicine
